# Patient Record
Sex: Female | Born: 1980 | Race: Black or African American | Hispanic: No | Marital: Single | Smoking: Former smoker
Health system: Southern US, Community
[De-identification: ages and names within clinical notes are randomized; demographics above are authoritative.]

## PROBLEM LIST (undated history)

## (undated) ENCOUNTER — Inpatient Hospital Stay (HOSPITAL_COMMUNITY): Payer: Self-pay

## (undated) DIAGNOSIS — D869 Sarcoidosis, unspecified: Secondary | ICD-10-CM

## (undated) HISTORY — PX: OTHER SURGICAL HISTORY: SHX169

---

## 1998-07-04 ENCOUNTER — Inpatient Hospital Stay (HOSPITAL_COMMUNITY): Admission: AD | Admit: 1998-07-04 | Discharge: 1998-07-04 | Payer: Self-pay | Admitting: Obstetrics

## 1998-12-27 ENCOUNTER — Encounter: Payer: Self-pay | Admitting: Emergency Medicine

## 1998-12-27 ENCOUNTER — Emergency Department (HOSPITAL_COMMUNITY): Admission: EM | Admit: 1998-12-27 | Discharge: 1998-12-27 | Payer: Self-pay | Admitting: Emergency Medicine

## 1999-09-04 ENCOUNTER — Emergency Department (HOSPITAL_COMMUNITY): Admission: EM | Admit: 1999-09-04 | Discharge: 1999-09-04 | Payer: Self-pay | Admitting: Emergency Medicine

## 1999-09-15 ENCOUNTER — Inpatient Hospital Stay (HOSPITAL_COMMUNITY): Admission: AD | Admit: 1999-09-15 | Discharge: 1999-09-15 | Payer: Self-pay | Admitting: Obstetrics & Gynecology

## 1999-10-02 ENCOUNTER — Other Ambulatory Visit: Admission: RE | Admit: 1999-10-02 | Discharge: 1999-10-22 | Payer: Self-pay | Admitting: Gynecology

## 1999-10-03 ENCOUNTER — Other Ambulatory Visit: Admission: RE | Admit: 1999-10-03 | Discharge: 1999-10-03 | Payer: Self-pay | Admitting: Obstetrics and Gynecology

## 1999-10-07 ENCOUNTER — Ambulatory Visit (HOSPITAL_COMMUNITY): Admission: RE | Admit: 1999-10-07 | Discharge: 1999-10-07 | Payer: Self-pay | Admitting: Obstetrics and Gynecology

## 1999-10-07 ENCOUNTER — Encounter: Payer: Self-pay | Admitting: Obstetrics and Gynecology

## 1999-11-15 ENCOUNTER — Inpatient Hospital Stay (HOSPITAL_COMMUNITY): Admission: AD | Admit: 1999-11-15 | Discharge: 1999-11-15 | Payer: Self-pay | Admitting: Obstetrics and Gynecology

## 2000-01-24 ENCOUNTER — Ambulatory Visit (HOSPITAL_COMMUNITY): Admission: RE | Admit: 2000-01-24 | Discharge: 2000-01-24 | Payer: Self-pay | Admitting: Obstetrics and Gynecology

## 2000-01-24 ENCOUNTER — Encounter: Payer: Self-pay | Admitting: Obstetrics and Gynecology

## 2000-03-05 ENCOUNTER — Inpatient Hospital Stay (HOSPITAL_COMMUNITY): Admission: AD | Admit: 2000-03-05 | Discharge: 2000-03-07 | Payer: Self-pay | Admitting: Obstetrics and Gynecology

## 2000-03-05 ENCOUNTER — Encounter (INDEPENDENT_AMBULATORY_CARE_PROVIDER_SITE_OTHER): Payer: Self-pay

## 2000-03-08 ENCOUNTER — Encounter: Admission: RE | Admit: 2000-03-08 | Discharge: 2000-05-06 | Payer: Self-pay | Admitting: Obstetrics and Gynecology

## 2000-05-24 ENCOUNTER — Inpatient Hospital Stay (HOSPITAL_COMMUNITY): Admission: AD | Admit: 2000-05-24 | Discharge: 2000-05-24 | Payer: Self-pay | Admitting: Obstetrics and Gynecology

## 2000-06-09 ENCOUNTER — Inpatient Hospital Stay (HOSPITAL_COMMUNITY): Admission: AD | Admit: 2000-06-09 | Discharge: 2000-06-09 | Payer: Self-pay | Admitting: Obstetrics & Gynecology

## 2000-06-09 ENCOUNTER — Encounter: Payer: Self-pay | Admitting: Obstetrics & Gynecology

## 2000-10-18 ENCOUNTER — Inpatient Hospital Stay (HOSPITAL_COMMUNITY): Admission: EM | Admit: 2000-10-18 | Discharge: 2000-10-22 | Payer: Self-pay | Admitting: Emergency Medicine

## 2000-10-19 ENCOUNTER — Encounter: Payer: Self-pay | Admitting: Emergency Medicine

## 2000-10-27 ENCOUNTER — Encounter: Admission: RE | Admit: 2000-10-27 | Discharge: 2000-10-27 | Payer: Self-pay | Admitting: Family Medicine

## 2001-01-29 ENCOUNTER — Emergency Department (HOSPITAL_COMMUNITY): Admission: EM | Admit: 2001-01-29 | Discharge: 2001-01-29 | Payer: Self-pay | Admitting: Emergency Medicine

## 2001-01-29 ENCOUNTER — Encounter: Payer: Self-pay | Admitting: Emergency Medicine

## 2001-11-01 ENCOUNTER — Emergency Department (HOSPITAL_COMMUNITY): Admission: EM | Admit: 2001-11-01 | Discharge: 2001-11-01 | Payer: Self-pay | Admitting: Emergency Medicine

## 2001-11-01 ENCOUNTER — Encounter: Payer: Self-pay | Admitting: Emergency Medicine

## 2002-03-18 ENCOUNTER — Inpatient Hospital Stay (HOSPITAL_COMMUNITY): Admission: AD | Admit: 2002-03-18 | Discharge: 2002-03-18 | Payer: Self-pay | Admitting: Obstetrics & Gynecology

## 2002-03-22 ENCOUNTER — Ambulatory Visit (HOSPITAL_COMMUNITY): Admission: RE | Admit: 2002-03-22 | Discharge: 2002-03-22 | Payer: Self-pay | Admitting: Family Medicine

## 2002-03-22 ENCOUNTER — Encounter: Payer: Self-pay | Admitting: Family Medicine

## 2003-11-20 ENCOUNTER — Inpatient Hospital Stay (HOSPITAL_COMMUNITY): Admission: AD | Admit: 2003-11-20 | Discharge: 2003-11-20 | Payer: Self-pay | Admitting: Obstetrics & Gynecology

## 2005-02-23 ENCOUNTER — Emergency Department (HOSPITAL_COMMUNITY): Admission: EM | Admit: 2005-02-23 | Discharge: 2005-02-24 | Payer: Self-pay | Admitting: Emergency Medicine

## 2005-08-04 ENCOUNTER — Emergency Department (HOSPITAL_COMMUNITY): Admission: EM | Admit: 2005-08-04 | Discharge: 2005-08-04 | Payer: Self-pay | Admitting: Emergency Medicine

## 2005-08-07 ENCOUNTER — Emergency Department (HOSPITAL_COMMUNITY): Admission: EM | Admit: 2005-08-07 | Discharge: 2005-08-07 | Payer: Self-pay | Admitting: Emergency Medicine

## 2005-12-07 ENCOUNTER — Emergency Department (HOSPITAL_COMMUNITY): Admission: EM | Admit: 2005-12-07 | Discharge: 2005-12-07 | Payer: Self-pay | Admitting: Emergency Medicine

## 2006-02-02 ENCOUNTER — Emergency Department (HOSPITAL_COMMUNITY): Admission: EM | Admit: 2006-02-02 | Discharge: 2006-02-02 | Payer: Self-pay | Admitting: Emergency Medicine

## 2006-12-10 ENCOUNTER — Inpatient Hospital Stay (HOSPITAL_COMMUNITY): Admission: AD | Admit: 2006-12-10 | Discharge: 2006-12-10 | Payer: Self-pay | Admitting: Obstetrics and Gynecology

## 2007-02-22 ENCOUNTER — Emergency Department (HOSPITAL_COMMUNITY): Admission: EM | Admit: 2007-02-22 | Discharge: 2007-02-22 | Payer: Self-pay | Admitting: Family Medicine

## 2007-04-01 ENCOUNTER — Emergency Department (HOSPITAL_COMMUNITY): Admission: EM | Admit: 2007-04-01 | Discharge: 2007-04-01 | Payer: Self-pay | Admitting: Emergency Medicine

## 2007-08-10 ENCOUNTER — Emergency Department (HOSPITAL_COMMUNITY): Admission: EM | Admit: 2007-08-10 | Discharge: 2007-08-11 | Payer: Self-pay | Admitting: Pediatrics

## 2007-10-15 ENCOUNTER — Emergency Department (HOSPITAL_COMMUNITY): Admission: EM | Admit: 2007-10-15 | Discharge: 2007-10-15 | Payer: Self-pay | Admitting: Emergency Medicine

## 2008-08-10 ENCOUNTER — Emergency Department (HOSPITAL_COMMUNITY): Admission: EM | Admit: 2008-08-10 | Discharge: 2008-08-10 | Payer: Self-pay | Admitting: Emergency Medicine

## 2009-02-24 ENCOUNTER — Emergency Department (HOSPITAL_COMMUNITY): Admission: EM | Admit: 2009-02-24 | Discharge: 2009-02-24 | Payer: Self-pay | Admitting: Family Medicine

## 2010-03-29 LAB — POCT RAPID STREP A (OFFICE): Streptococcus, Group A Screen (Direct): NEGATIVE

## 2010-04-14 LAB — URINE MICROSCOPIC-ADD ON

## 2010-04-14 LAB — URINALYSIS, ROUTINE W REFLEX MICROSCOPIC
Bilirubin Urine: NEGATIVE
Glucose, UA: NEGATIVE mg/dL
Ketones, ur: 15 mg/dL — AB
Nitrite: POSITIVE — AB
Protein, ur: >300 mg/dL — AB
Specific Gravity, Urine: 1.022 (ref 1.005–1.030)
Urobilinogen, UA: 1 mg/dL (ref 0.0–1.0)
pH: 6 (ref 5.0–8.0)

## 2010-04-14 LAB — POCT PREGNANCY, URINE: Preg Test, Ur: NEGATIVE

## 2010-05-24 NOTE — Discharge Summary (Signed)
[facility]  Patient:    Katelyn Stevens, Katelyn Stevens                          MRN: [id number] Adm. Date:  [id number] Disc. Date: [id number] Attending:  Marcelle Overlie                           Discharge Summary  DISCHARGE DIAGNOSES:          1. Term pregnancy delivered 6 pound 6 ounce female                                  infant with Apgars 9 and 9.                               2. Blood type O+.                               3. Suspected chorioamnionitis.  PROCEDURE:                    1. Induction of labor.                               2. Intrapartum antibiotics.                               3. Normal spontaneous delivery.  SUMMARY:                      This 30 year old gravida 5, para 0 with a due date of February 25 was admitted for induction because of suspected chorioamnionitis.  At the time of admission the patient was found to have a temperature of 101 and a fetal heart in the range of 180.  The patients white count was 24,000.  Her pregnancy was complicated by the anatomic finding of a uterine ______.  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                                Patient was admitted, placed on Unasyn and Pitocin, and had rapid progression with a subsequent normal spontaneous delivery over an intact perineum of a 6 pound 6 ounce female infant with Apgars of 9 and 9.  The patients post delivery course was benign.  Her discharge hemoglobin was 8.5 with a white count of 32,000, platelet count of 235,000. On the morning of March 2 she had been afebrile for approximately 24 hours and was doing well.  She was breast-feeding without difficulty.  She was given all appropriate instructions and discharged to home.  DISCHARGE MEDICATIONS:        1. Vitamin one q.d.                               2. Ferrous sulfate 300 mg q.d.                               3. Tylox one  to two q.4-6h. p.r.n. severe pain.                               4. Motrin 600 mg q.6h. as needed for  less severe                                  pain.  FOLLOW-UP:                    Four weeks time.  Was carefully instructed as to fever, etcetera.  She was given a detailed instruction sheet at the time of discharge.  CONDITION ON DISCHARGE:       Improved. DD:  04/01/00 TD:  04/01/00 Job: [id number]

## 2010-05-24 NOTE — Discharge Summary (Signed)
[place]  Patient:    Katelyn Stevens, Katelyn Stevens Visit Number: [id number] MRN: [id number]          Service Type: MED Location: [id number] Attending Physician:  Tobin Chad Dictated by:   Janalyn Harder, MS-IV Admit Date:  10/18/2000 Discharge Date: 10/22/2000                             Discharge Summary  PRIMARY PHYSICIAN:  Unassigned.  ATTENDING PHYSICIAN:  Engineer, water Service - Wayne A. Sheffield Slider, M.D.  RESIDENT PHYSICIAN:  Pricilla Holm, M.D.  DIAGNOSES: 1. Pyelonephritis. 2. Uterine didelphys.  PRINCIPAL PROCEDURES: 1. CT scan of the abdomen and pelvis. 2. Intravenous antibiotics.  HISTORY OF PRESENT ILLNESS:  Please see admission H&P for full history and physical.  HOSPITAL COURSE:  Katelyn Stevens was admitted on October 18, 2000 complaining of fever, chills and extreme low back pain.  She was found to have a urinary tract infection after urinalysis.  A CT scan was obtained to determine other possible sources of infection other than pyelonephritis secondary to her marked back pain on exam on admission.  Also the patient has a history of uterine didelphys which was concerning for a urinary tract malformation. It was determined that the patient did in fact have pyelonephritis.  No other sources of infection were noted.  She was started on IV Tequin.  Urine culture was obtained.  Culture came back as E. coli which was sensitive to ciprofloxacin so the patient was changed over to IV ciprofloxacin times 24 hours and then changed to p.o. ciprofloxacin.  The patient will be discharged on p.o. Cipro 500 mg one p.o. b.i.d. x7 days.  This patient was discussed with Dr. Annabell Howells of urology secondary to concerns for possible urinary tract malformations with the history of uterine didelphys.  At this time, Dr. Annabell Howells felt that because this was the first episode of pyelonephritis in this patient, that a formal urologic workup was unnecessary;  however, he advised that should the patient experience pyelonephritis again in the future that she will warrant a urologic workup.  The patient has been advised of this.  DISCHARGE MEDICATIONS: Ciprofloxacin 500 mg one p.o. b.i.d. x7 days.  FOLLOW-UP:  The patient will return to see Solon Palm, M.D. of the [facility] on October 29, 2000 at 11:10 a.m. Dictated by:   Janalyn Harder, MS-IV Attending Physician:  Tobin Chad DD:  10/22/00 TD:  10/23/00 Job: [id number]

## 2010-09-30 LAB — POCT URINALYSIS DIP (DEVICE)
Bilirubin Urine: NEGATIVE
Hgb urine dipstick: NEGATIVE
Ketones, ur: NEGATIVE
Protein, ur: NEGATIVE
Specific Gravity, Urine: <=1.005
pH: 6.5

## 2010-09-30 LAB — WET PREP, GENITAL
Clue Cells Wet Prep HPF POC: NONE SEEN
Trich, Wet Prep: NONE SEEN
Yeast Wet Prep HPF POC: NONE SEEN

## 2010-09-30 LAB — POCT PREGNANCY, URINE: Operator id: 235561

## 2010-10-07 ENCOUNTER — Inpatient Hospital Stay (INDEPENDENT_AMBULATORY_CARE_PROVIDER_SITE_OTHER)
Admission: RE | Admit: 2010-10-07 | Discharge: 2010-10-07 | Disposition: A | Discharge status: Home / Self Care | Payer: Self-pay | Source: Ambulatory Visit | Attending: Family Medicine | Admitting: Family Medicine

## 2010-10-07 DIAGNOSIS — N949 Unspecified condition associated with female genital organs and menstrual cycle: Secondary | ICD-10-CM

## 2010-10-07 LAB — POCT PREGNANCY, URINE: Preg Test, Ur: NEGATIVE

## 2011-01-30 ENCOUNTER — Encounter (HOSPITAL_COMMUNITY): Payer: Self-pay | Admitting: *Deleted

## 2011-01-30 ENCOUNTER — Emergency Department (HOSPITAL_COMMUNITY)
Admission: EM | Admit: 2011-01-30 | Discharge: 2011-01-30 | Disposition: A | Discharge status: Home / Self Care | Payer: Self-pay | Attending: Emergency Medicine | Admitting: Emergency Medicine

## 2011-01-30 DIAGNOSIS — H5789 Other specified disorders of eye and adnexa: Secondary | ICD-10-CM | POA: Insufficient documentation

## 2011-01-30 DIAGNOSIS — J3489 Other specified disorders of nose and nasal sinuses: Secondary | ICD-10-CM | POA: Insufficient documentation

## 2011-01-30 DIAGNOSIS — R0981 Nasal congestion: Secondary | ICD-10-CM

## 2011-01-30 DIAGNOSIS — H5710 Ocular pain, unspecified eye: Secondary | ICD-10-CM | POA: Insufficient documentation

## 2011-01-30 DIAGNOSIS — H579 Unspecified disorder of eye and adnexa: Secondary | ICD-10-CM | POA: Insufficient documentation

## 2011-01-30 DIAGNOSIS — H53149 Visual discomfort, unspecified: Secondary | ICD-10-CM | POA: Insufficient documentation

## 2011-01-30 MED ORDER — HYDROXYPROPYL METHYLCELLULOSE 2.5 % OP SOLN: 1.0000 [drp] | Freq: Four times a day (QID) | OPHTHALMIC | Status: DC | PRN

## 2011-01-30 MED ORDER — NAPHAZOLINE-PHENIRAMINE 0.025-0.3 % OP SOLN: 1.0000 [drp] | OPHTHALMIC | Status: AC | PRN

## 2011-01-30 MED ORDER — FLUORESCEIN SODIUM 1 MG OP STRP
1.0000 | ORAL_STRIP | Freq: Once | OPHTHALMIC | Status: AC
  Administered 2011-01-30: 1 via OPHTHALMIC
  Filled 2011-01-30: qty 1

## 2011-01-30 MED ORDER — FLUTICASONE PROPIONATE 50 MCG/ACT NA SUSP: 2.0000 | Freq: Every day | NASAL | Status: DC

## 2011-01-30 MED ORDER — TETRACAINE HCL 0.5 % OP SOLN
1.0000 [drp] | Freq: Once | OPHTHALMIC | Status: AC
  Administered 2011-01-30: 1 [drp] via OPHTHALMIC
  Filled 2011-01-30: qty 2

## 2011-01-30 NOTE — ED Notes (Signed)
Pt reports nasal congestion that is causing her difficulty breathing.  Denies having a cold or feeling like she needs to blow her nose.  Pt also reporting (R) eye discomfort and photophobia.  (R) eye noted to be red.  Denies injury.

## 2011-01-30 NOTE — ED Notes (Addendum)
Patient complaining of right eye pain since last night; rates pain 7/10 on the numerical pain scale.  Describes pain as "burning", "scratching", and "irritating".  Patient reports light sensitivity; states that the light makes her want to keep her right eye closed.  Patient feels that she has a foreign body seen in her eye; none seen upon assessment.  Patient states that she has had pink eye in the past, but that it has never felt like this.  Denies discharge.  Upon assessment, both eyes are red.  Patient alert and oriented x4; PERRL present.  Will continue to monitor.

## 2011-01-30 NOTE — ED Provider Notes (Signed)
History     CSN: [id number]  Arrival date & time 01/30/11  0605   First MD Initiated Contact with Patient 01/30/11 0615      Chief Complaint  Patient presents with  . Eye Problem  . Nasal Congestion    (Consider location/radiation/quality/duration/timing/severity/associated sxs/prior treatment) HPI Comments: Pt presents to the ED w c/o right eye pain, redness, and photophobia. She has had conjunctivitis in past before but states this feels different. She denies change in vision including double vision, blurred vision, pain with EOM or black spots. In addition pt c/o nasal congestion that has been occuring for about a week. She has been using Afrin and saline nasal spray to treat. Symptoms persisted with only mild improvement. Pt denies HA, fever, night sweats and chills. No other complaints  The history is provided by the patient.    History reviewed. No pertinent past medical history.  History reviewed. No pertinent past surgical history.  History reviewed. No pertinent family history.  History  Substance Use Topics  . Smoking status: Not on file  . Smokeless tobacco: Not on file  . Alcohol Use: Not on file    OB History    Grav Para Term Preterm Abortions TAB SAB Ect Mult Living                  Review of Systems  Constitutional: Negative for fever, chills and appetite change.  HENT: Positive for congestion and rhinorrhea. Negative for sore throat.   Eyes: Positive for photophobia, redness and itching. Negative for pain, discharge and visual disturbance.  Respiratory: Negative for shortness of breath.   Cardiovascular: Negative for chest pain and leg swelling.  Gastrointestinal: Negative for abdominal pain.  Genitourinary: Negative for dysuria, urgency and frequency.  Neurological: Negative for dizziness, syncope, weakness, light-headedness, numbness and headaches.  Psychiatric/Behavioral: Negative for confusion.  All other systems reviewed and are  negative.    Allergies  Review of patient's allergies indicates no known allergies.  Home Medications  No current outpatient prescriptions on file.  BP 106/72  Pulse 81  Temp 97.8 F (36.6 C)  Resp 18  SpO2 99%  Physical Exam  Nursing note and vitals reviewed. Constitutional: She is oriented to person, place, and time. She appears well-developed and well-nourished. No distress.  HENT:  Head: Normocephalic and atraumatic.  Nose: Rhinorrhea present.       No maxillary or frontal tenderness to sinuses. No pre or post auricular lymphadenopathy. No presence of rash or herpetic type lesions on face or neck  Eyes: Conjunctivae and EOM are normal.       No increase in IOP bilaterally, no change in visual acuity and equal bilaterally, no fluorescein uptake in affected eye no dendritic lesions. Right eye injected with out drainage. Photophobia in only left eye, NO consensual photophobia present. PERRLA.  Neck: Normal range of motion.  Pulmonary/Chest: Effort normal.  Musculoskeletal: Normal range of motion.  Neurological: She is alert and oriented to person, place, and time.  Skin: Skin is warm and dry. No rash noted. She is not diaphoretic.  Psychiatric: She has a normal mood and affect. Her behavior is normal.    ED Course  Procedures (including critical care time)  Labs Reviewed - No data to display No results found.   No diagnosis found.     MDM  Viral conjunctivitis   Pt PE is non concerning for emergent eye problem. Discussed in depth about need to follow up with opthalmology if there is a  change in vision in any way or symptoms do not improve. She is aware of her diagnosis and that it is highly contagious. Discussed precautions to keep infection from spread. Pt to be dc with artificial tears and a vasoconstrictor/antihistamine for itching. In regards to nasal congestion she has been advised not to use Afrin for its addictive and rebound effects. Pt will be given Flonase.        Jaci Carrel, [place] 01/30/11 4098  Medical screening examination/treatment/procedure(s) were performed by non-physician practitioner and as supervising physician I was immediately available for consultation/collaboration.  Sunnie Nielsen, MD 01/30/11 2252

## 2011-01-30 NOTE — ED Notes (Signed)
Jaci Carrel, [place] at bedside -- notified PA that tetracaine and fluorescein strip is at bedside.

## 2011-02-08 ENCOUNTER — Emergency Department (HOSPITAL_COMMUNITY): Admission: EM | Admit: 2011-02-08 | Discharge: 2011-02-08 | Payer: Self-pay | Source: Home / Self Care

## 2011-04-07 ENCOUNTER — Emergency Department (HOSPITAL_COMMUNITY)
Admission: EM | Admit: 2011-04-07 | Discharge: 2011-04-07 | Disposition: A | Discharge status: Home / Self Care | Payer: Self-pay | Attending: Emergency Medicine | Admitting: Emergency Medicine

## 2011-04-07 ENCOUNTER — Encounter (HOSPITAL_COMMUNITY): Payer: Self-pay | Admitting: Emergency Medicine

## 2011-04-07 DIAGNOSIS — Z87891 Personal history of nicotine dependence: Secondary | ICD-10-CM | POA: Insufficient documentation

## 2011-04-07 DIAGNOSIS — J329 Chronic sinusitis, unspecified: Secondary | ICD-10-CM | POA: Insufficient documentation

## 2011-04-07 MED ORDER — AMOXICILLIN 500 MG PO CAPS: 1000.0000 mg | ORAL_CAPSULE | Freq: Two times a day (BID) | ORAL | Status: AC

## 2011-04-07 MED ORDER — IBUPROFEN 800 MG PO TABS: 800.0000 mg | ORAL_TABLET | Freq: Three times a day (TID) | ORAL | Status: AC

## 2011-04-07 MED ORDER — PREDNISONE (PAK) 10 MG PO TABS: ORAL_TABLET | ORAL | Status: AC

## 2011-04-07 NOTE — ED Notes (Signed)
Pt. Staed, I've been having breathing problems and i think it all stems from a dental problem

## 2011-04-07 NOTE — ED Notes (Signed)
Pt reports not being able to breath through her nose x 2 months.  States that she has been to the ED for eval and was given an OTC nasal spray without relief.  States that her mouth is dry from breathing through her mouth.  Denies cough, chills, stuffy nose or drainage.

## 2011-04-07 NOTE — ED Provider Notes (Signed)
Medical screening examination/treatment/procedure(s) were conducted as a shared visit with non-physician practitioner(s) and myself.  I personally evaluated the patient during the encounter   Gerhard Munch, MD 04/07/11 1527

## 2011-04-07 NOTE — Discharge Instructions (Signed)

## 2011-04-07 NOTE — ED Provider Notes (Signed)
History     CSN: [id number]  Arrival date & time 04/07/11  0757   First MD Initiated Contact with Patient 04/07/11 0815     8:34 AM HPI Patient reports nasal congestion for 2 months. States now she has had increasing difficulty with breathing through her nose. States she was seen for this in January and given a nasal spray. Reports initially was helped but is no longer helping. States when she lays flat her nasal congestion is worse and she begins to feel short of breath. Reports constantly mouth breathing and now has dry lips and nares. Denies chest pain, cough, sneezing, sore throat, headache, fever, neck pain, headache. Reports 3 years ago had a right upper third molar extracted. States within the last month has had significant amount of food but continues to "get stuck in there." Patient states this is nontender and she has had no drainage but she is wondering if this is causing persistent nasal congestion. Patient is a 31 y.o. female presenting with sinusitis. The history is provided by the patient.  Sinusitis  This is a chronic problem. Episode onset: 2 months. The problem has been gradually worsening. There has been no fever. The patient is experiencing no pain. Associated symptoms include congestion, sinus pressure, swollen glands and shortness of breath. Pertinent negatives include no chills, no sweats, no ear pain, no hoarse voice, no sore throat and no cough. Treatments tried: Afrin. The treatment provided mild relief.    History reviewed. No pertinent past medical history.  History reviewed. No pertinent past surgical history.  History reviewed. No pertinent family history.  History  Substance Use Topics  . Smoking status: Former Games developer  . Smokeless tobacco: Not on file  . Alcohol Use: No    OB History    Grav Para Term Preterm Abortions TAB SAB Ect Mult Living                  Review of Systems  Constitutional: Negative for fever and chills.  HENT: Positive for  congestion and sinus pressure. Negative for ear pain, nosebleeds, sore throat, hoarse voice, rhinorrhea, mouth sores, trouble swallowing, neck pain, dental problem, voice change and postnasal drip.   Respiratory: Positive for shortness of breath. Negative for cough.   Cardiovascular: Negative for chest pain.  Skin: Negative for rash.  Neurological: Negative for headaches.  All other systems reviewed and are negative.    Allergies  Review of patient's allergies indicates no known allergies.  Home Medications   Current Outpatient Rx  Name Route Sig Dispense Refill  . OXYMETAZOLINE HCL 0.05 % NA SOLN Nasal Place 2 sprays into the nose 2 (two) times daily as needed. For congestion.    . TYLENOL FLU PO Oral Take 2 capsules by mouth 2 (two) times daily as needed. For cold symptoms      BP 104/69  Pulse 81  Temp(Src) 98.3 F (36.8 C) (Oral)  Resp 18  SpO2 96%  LMP 03/07/2011  Physical Exam  Vitals reviewed. Constitutional: She is oriented to person, place, and time. Vital signs are normal. She appears well-developed and well-nourished.  HENT:  Head: Normocephalic and atraumatic.  Right Ear: Hearing, tympanic membrane, external ear and ear canal normal.  Left Ear: Hearing, tympanic membrane, external ear and ear canal normal.  Nose: No mucosal edema or rhinorrhea. Right sinus exhibits maxillary sinus tenderness. Right sinus exhibits no frontal sinus tenderness. Left sinus exhibits maxillary sinus tenderness. Left sinus exhibits no frontal sinus tenderness.  Mouth/Throat: Uvula  is midline, oropharynx is clear and moist and mucous membranes are normal.    Eyes: Conjunctivae are normal. Pupils are equal, round, and reactive to light.  Neck: Trachea normal and normal range of motion. Neck supple. No tracheal tenderness, no spinous process tenderness and no muscular tenderness present. No tracheal deviation present. No mass and no thyromegaly present.  Cardiovascular: Normal rate, regular  rhythm and normal heart sounds.  Exam reveals no friction rub.   No murmur heard. Pulmonary/Chest: Effort normal and breath sounds normal. No stridor. She has no wheezes. She has no rhonchi. She has no rales. She exhibits no tenderness.  Musculoskeletal: Normal range of motion.  Lymphadenopathy:       Head (right side): Submandibular and tonsillar adenopathy present.       Nontender lymphadenopathy  Neurological: She is alert and oriented to person, place, and time. Coordination normal.  Skin: Skin is warm and dry. No rash noted. No erythema. No pallor.    ED Course  Procedures   MDM   We'll treat patient with amoxicillin and steroid. Will provide patient with ENT referral and advised followup if symptoms continue despite medication. Patient agrees with plan and is ready for discharge.      Thomasene Lot, PA-C 04/07/11 [phone]

## 2011-05-23 ENCOUNTER — Encounter (HOSPITAL_COMMUNITY): Payer: Self-pay

## 2011-05-23 ENCOUNTER — Emergency Department (HOSPITAL_COMMUNITY)
Admission: EM | Admit: 2011-05-23 | Discharge: 2011-05-23 | Disposition: A | Discharge status: Home / Self Care | Payer: Self-pay | Attending: Emergency Medicine | Admitting: Emergency Medicine

## 2011-05-23 DIAGNOSIS — J3489 Other specified disorders of nose and nasal sinuses: Secondary | ICD-10-CM | POA: Insufficient documentation

## 2011-05-23 DIAGNOSIS — R059 Cough, unspecified: Secondary | ICD-10-CM | POA: Insufficient documentation

## 2011-05-23 DIAGNOSIS — Z79899 Other long term (current) drug therapy: Secondary | ICD-10-CM | POA: Insufficient documentation

## 2011-05-23 DIAGNOSIS — R05 Cough: Secondary | ICD-10-CM | POA: Insufficient documentation

## 2011-05-23 DIAGNOSIS — R0602 Shortness of breath: Secondary | ICD-10-CM | POA: Insufficient documentation

## 2011-05-23 DIAGNOSIS — J309 Allergic rhinitis, unspecified: Secondary | ICD-10-CM | POA: Insufficient documentation

## 2011-05-23 MED ORDER — FLUTICASONE PROPIONATE 50 MCG/ACT NA SUSP: 2.0000 | Freq: Every day | NASAL | Status: DC

## 2011-05-23 MED ORDER — ALBUTEROL SULFATE HFA 108 (90 BASE) MCG/ACT IN AERS: 1.0000 | INHALATION_SPRAY | Freq: Four times a day (QID) | RESPIRATORY_TRACT | Status: DC | PRN

## 2011-05-23 MED ORDER — AEROCHAMBER Z-STAT PLUS/MEDIUM MISC: Status: DC

## 2011-05-23 NOTE — ED Notes (Signed)
Pt conversing on cell phone on arrival to triage by RN, pt sts she cant breath now for several months, seen here for same and sts had some improvement but now back to how it was. Mainly nasal congestion.

## 2011-05-23 NOTE — Discharge Instructions (Signed)
You can use a nasal spray like Afrin as needed for congestion while you are on the nasal steroid. Use of the resource guide to find a Dr. to see for ongoing care.  Allergic Rhinitis Allergic rhinitis is when the mucous membranes in the nose respond to allergens. Allergens are particles in the air that cause your body to have an allergic reaction. This causes you to release allergic antibodies. Through a chain of events, these eventually cause you to release histamine into the blood stream (hence the use of antihistamines). Although meant to be protective to the body, it is this release that causes your discomfort, such as frequent sneezing, congestion and an itchy runny nose.  CAUSES  The pollen allergens may come from grasses, trees, and weeds. This is seasonal allergic rhinitis, or "hay fever." Other allergens cause year-round allergic rhinitis (perennial allergic rhinitis) such as house dust mite allergen, pet dander and mold spores.  SYMPTOMS   Nasal stuffiness (congestion).   Runny, itchy nose with sneezing and tearing of the eyes.   There is often an itching of the mouth, eyes and ears.  It cannot be cured, but it can be controlled with medications. DIAGNOSIS  If you are unable to determine the offending allergen, skin or blood testing may find it. TREATMENT   Avoid the allergen.   Medications and allergy shots (immunotherapy) can help.   Hay fever may often be treated with antihistamines in pill or nasal spray forms. Antihistamines block the effects of histamine. There are over-the-counter medicines that may help with nasal congestion and swelling around the eyes. Check with your caregiver before taking or giving this medicine.  If the treatment above does not work, there are many new medications your caregiver can prescribe. Stronger medications may be used if initial measures are ineffective. Desensitizing injections can be used if medications and avoidance fails. Desensitization is  when a patient is given ongoing shots until the body becomes less sensitive to the allergen. Make sure you follow up with your caregiver if problems continue. SEEK MEDICAL CARE IF:   You develop fever (more than 100.5 F (38.1 C).   You develop a cough that does not stop easily (persistent).   You have shortness of breath.   You start wheezing.   Symptoms interfere with normal daily activities.  Document Released: 09/17/2000 Document Revised: 12/12/2010 Document Reviewed: 03/29/2008 [facility] Patient Information 2012 [place], [place].Hay Fever Hay fever is an allergic reaction to particles in the air. It cannot be passed from person to person. It cannot be cured, but it can be controlled. CAUSES  Hay fever is caused by something that triggers an allergic reaction (allergens). The following are examples of allergens:  Ragweed.   Feathers.   Animal dander.   Grass and tree pollens.   Cigarette smoke.   House dust.   Pollution.  SYMPTOMS   Sneezing.   Runny or stuffy nose.   Tearing eyes.   Itchy eyes, nose, mouth, throat, skin, or other area.   Sore throat.   Headache.   Decreased sense of smell or taste.  DIAGNOSIS Your caregiver will perform a physical exam and ask questions about the symptoms you are having.Allergy testing may be done to determine exactly what triggers your hay fever.  TREATMENT   Over-the-counter medicines may help symptoms. These include:   Antihistamines.   Decongestants. These may help with nasal congestion.   Your caregiver may prescribe medicines if over-the-counter medicines do not work.   Some people benefit  from allergy shots when other medicines are not helpful.  HOME CARE INSTRUCTIONS   Avoid the allergen that is causing your symptoms, if possible.   Take all medicine as told by your caregiver.  SEEK MEDICAL CARE IF:   You have severe allergy symptoms and your current medicines are not helping.   Your treatment was  working at one time, but you are now experiencing symptoms.   You have sinus congestion and pressure.   You develop a fever or headache.   You have thick nasal discharge.   You have asthma and have a worsening cough and wheezing.  SEEK IMMEDIATE MEDICAL CARE IF:   You have swelling of your tongue or lips.   You have trouble breathing.   You feel lightheaded or like you are going to faint.   You have cold sweats.   You have a fever.  Document Released: 12/23/2004 Document Revised: 12/12/2010 Document Reviewed: 03/20/2010 [facility] Patient Information 2012 [place], [place].  RESOURCE GUIDE  Dental Problems  Patients with Medicaid: [facility] [phone] [address]                                           [phone] [address] Phone:  [phone]                                                  Phone:  [phone]  If unable to pay or uninsured, contact:  [facility] or [facility]. to become qualified for the adult dental clinic.  Chronic Pain Problems Contact [facility]  [phone] Patients need to be referred by their primary care doctor.  Insufficient Money for Medicine Contact [employer]:  call "211" or [facility] 4107502488.  No Primary Care Doctor Call [facility]  [phone] Other agencies that provide inexpensive medical care    [facility]  [phone]    [facility]  [phone]    [facility]  [phone]    [facility]  5024380015    [facility]  [phone]    [facility]  [phone]  Psychological Services [facility]  [phone] [facility] Services  [phone] [facility]   [phone] (emergency services [phone])  Substance Abuse Resources Alcohol and Drug Services  [phone] [facility] [phone] [facility] [place] [phone] Floydene Flock  [phone] Residential & Outpatient Substance Abuse Program  [phone]  Abuse/Neglect [facility] Child Abuse Hotline [phone] [facility] Child Abuse Hotline 639-202-4184 (After Hours)  Emergency Shelter [facility] [phone]  M[facility] [place] [facility] [place] 8605951148 [facility] [phone]  MRSA Hotline #:   (602)609-6484    [facility] Resources  [facility]     [employer]                          [facility] Dept. [address] [place]  [address]      [address]  Blondell Reveal Phone:  [phone]                                   Phone:  [phone]                 Phone:  [phone]  [facility] Mental Health Phone:  [phone]  [facility] Child Abuse Hotline [phone] (After Hours)

## 2011-05-23 NOTE — ED Notes (Signed)
Pt has ongoing SOB that she has been previously seen for. Pt states that she has not been diagnosed with respiratory illness and does not take any meds. Pt states that SOB is worse at night, she is afraid to go to sleep.

## 2011-05-23 NOTE — ED Provider Notes (Signed)
History  This chart was scribed for Flint Melter, MD by Bennett Scrape. This patient was seen in room STRE1/STRE1 and the patient's care was started at 12:45PM.  CSN: [id number]  Arrival date & time 05/23/11  1141   First MD Initiated Contact with Patient 05/23/11 1245      Chief Complaint  Patient presents with  . Shortness of Breath     The history is provided by the patient. No language interpreter was used.    Katelyn Stevens is a 31 y.o. female who presents to the Emergency Department complaining of several months of gradual onset, non-changing, constant SOB described as not being able to take a deep breath with associated nasal congestion and cough. She states that she was seen here last month for the same symptoms and was given prednisone, ibuprofen and amoxicillin. She states that the prescriptions did improve her symptoms temporarily. Pt states that she has also changed the filters in her house and has stopped doing her normal work out routine without improvement. She denies fever, nausea, emesis, chest pain and sore throat associated symptoms. She has no h/o chronic medical problems. She is a former smoker but denies alcohol use.   History reviewed. No pertinent past medical history.  History reviewed. No pertinent past surgical history.  History reviewed. No pertinent family history.  History  Substance Use Topics  . Smoking status: Former Games developer  . Smokeless tobacco: Not on file  . Alcohol Use: No     Review of Systems  A complete 10 system review of systems was obtained and all systems are negative except as noted in the HPI and PMH.   Allergies  Review of patient's allergies indicates no known allergies.  Home Medications   Current Outpatient Rx  Name Route Sig Dispense Refill  . TYLENOL FLU PO Oral Take 2 capsules by mouth 2 (two) times daily as needed. For cold symptoms    . ALBUTEROL SULFATE HFA 108 (90 BASE) MCG/ACT IN AERS Inhalation Inhale 1-2  puffs into the lungs every 6 (six) hours as needed for wheezing. 1 Inhaler 0  . FLUTICASONE PROPIONATE 50 MCG/ACT NA SUSP Nasal Place 2 sprays into the nose daily. 16 g 0  . AEROCHAMBER Z-STAT PLUS/MEDIUM MISC  Use as instructed 1 each 2    With inhaler    Triage Vitals: BP 90/59  Pulse 75  Temp(Src) 98.2 F (36.8 C) (Oral)  Resp 18  SpO2 95%  LMP 05/07/2011  Physical Exam  Nursing note and vitals reviewed. Constitutional: She is oriented to person, place, and time. She appears well-developed and well-nourished.  HENT:  Head: Normocephalic and atraumatic.  Eyes: Conjunctivae and EOM are normal. Pupils are equal, round, and reactive to light.  Neck: Normal range of motion and phonation normal. Neck supple.  Cardiovascular: Normal rate, regular rhythm and intact distal pulses.   Pulmonary/Chest: Effort normal and breath sounds normal. She has no wheezes. She exhibits no tenderness.       Decreased air movement bilaterally  Abdominal: Soft. She exhibits no distension. There is no tenderness. There is no guarding.  Musculoskeletal: Normal range of motion.  Neurological: She is alert and oriented to person, place, and time. She has normal strength. She exhibits normal muscle tone.  Skin: Skin is warm and dry.  Psychiatric: She has a normal mood and affect. Her behavior is normal. Judgment and thought content normal.    ED Course  Procedures (including critical care time)  DIAGNOSTIC STUDIES: Oxygen  Saturation is 95% on room air, adequate by my interpretation.    COORDINATION OF CARE: 1:37PM-Discussed nasal spray, Afrin as needed and inhaler as needed and pt agreed. Advised to follow up with a PCP.    Labs Reviewed - No data to display No results found.   1. Allergic rhinitis       MDM  Allergic rhinitis; doubt sinusitis, serious bacterial infection or metabolic instability. She likely has secondary bronchospasm. She is stable for discharge.      I personally  performed the services described in this documentation, which was scribed in my presence. The recorded information has been reviewed and considered.     Plan: Home Medications- Flonase and Albuterol inhalers; Home Treatments- Rest, Fluids; Recommended follow up- PCP prn   Flint Melter, MD 05/23/11 2035

## 2011-06-15 ENCOUNTER — Emergency Department (HOSPITAL_COMMUNITY): Payer: Self-pay

## 2011-06-15 ENCOUNTER — Encounter (HOSPITAL_COMMUNITY): Payer: Self-pay | Admitting: *Deleted

## 2011-06-15 ENCOUNTER — Emergency Department (HOSPITAL_COMMUNITY)
Admission: EM | Admit: 2011-06-15 | Discharge: 2011-06-15 | Disposition: A | Discharge status: Home / Self Care | Payer: Self-pay | Attending: Emergency Medicine | Admitting: Emergency Medicine

## 2011-06-15 DIAGNOSIS — R059 Cough, unspecified: Secondary | ICD-10-CM | POA: Insufficient documentation

## 2011-06-15 DIAGNOSIS — R05 Cough: Secondary | ICD-10-CM | POA: Insufficient documentation

## 2011-06-15 DIAGNOSIS — IMO0002 Reserved for concepts with insufficient information to code with codable children: Secondary | ICD-10-CM | POA: Insufficient documentation

## 2011-06-15 DIAGNOSIS — R0602 Shortness of breath: Secondary | ICD-10-CM | POA: Insufficient documentation

## 2011-06-15 DIAGNOSIS — R599 Enlarged lymph nodes, unspecified: Secondary | ICD-10-CM | POA: Insufficient documentation

## 2011-06-15 DIAGNOSIS — J45909 Unspecified asthma, uncomplicated: Secondary | ICD-10-CM | POA: Insufficient documentation

## 2011-06-15 DIAGNOSIS — Z79899 Other long term (current) drug therapy: Secondary | ICD-10-CM | POA: Insufficient documentation

## 2011-06-15 DIAGNOSIS — R591 Generalized enlarged lymph nodes: Secondary | ICD-10-CM

## 2011-06-15 LAB — COMPREHENSIVE METABOLIC PANEL
ALT: 20 U/L (ref 0–35)
AST: 44 U/L — ABNORMAL HIGH (ref 0–37)
Albumin: 3.3 g/dL — ABNORMAL LOW (ref 3.5–5.2)
CO2: 23 mEq/L (ref 19–32)
Calcium: 9.4 mg/dL (ref 8.4–10.5)
Chloride: 101 mEq/L (ref 96–112)
Creatinine, Ser: 0.82 mg/dL (ref 0.50–1.10)
GFR calc non Af Amer: >90 mL/min (ref >90)
Sodium: 135 mEq/L (ref 135–145)

## 2011-06-15 LAB — SEDIMENTATION RATE: Sed Rate: 9 mm/hr (ref 0–22)

## 2011-06-15 LAB — DIFFERENTIAL
Basophils Absolute: 0 10*3/uL (ref 0.0–0.1)
Basophils Relative: 0 % (ref 0–1)
Lymphocytes Relative: 25 % (ref 12–46)
Monocytes Absolute: 0.8 10*3/uL (ref 0.1–1.0)
Neutro Abs: 2.5 10*3/uL (ref 1.7–7.7)

## 2011-06-15 LAB — CBC
MCHC: 35.6 g/dL (ref 30.0–36.0)
Platelets: 231 10*3/uL (ref 150–400)
RDW: 14.8 % (ref 11.5–15.5)
WBC: 4.5 10*3/uL (ref 4.0–10.5)

## 2011-06-15 LAB — URINALYSIS, ROUTINE W REFLEX MICROSCOPIC
Bilirubin Urine: NEGATIVE
Glucose, UA: NEGATIVE mg/dL
Hgb urine dipstick: NEGATIVE
Specific Gravity, Urine: 1.017 (ref 1.005–1.030)

## 2011-06-15 LAB — POCT PREGNANCY, URINE: Preg Test, Ur: NEGATIVE

## 2011-06-15 MED ORDER — IOHEXOL 300 MG/ML  SOLN
80.0000 mL | Freq: Once | INTRAMUSCULAR | Status: AC | PRN
  Administered 2011-06-15: 80 mL via INTRAVENOUS

## 2011-06-15 MED ORDER — IPRATROPIUM BROMIDE 0.02 % IN SOLN
0.5000 mg | Freq: Once | RESPIRATORY_TRACT | Status: AC
Start: 2011-06-15 — End: 2011-06-15
  Administered 2011-06-15: 0.5 mg via RESPIRATORY_TRACT
  Filled 2011-06-15: qty 2.5

## 2011-06-15 MED ORDER — ALBUTEROL SULFATE (5 MG/ML) 0.5% IN NEBU
5.0000 mg | INHALATION_SOLUTION | Freq: Once | RESPIRATORY_TRACT | Status: AC
  Administered 2011-06-15: 5 mg via RESPIRATORY_TRACT
  Filled 2011-06-15: qty 1

## 2011-06-15 MED ORDER — PREDNISONE 20 MG PO TABS: 40.0000 mg | ORAL_TABLET | Freq: Every day | ORAL | Status: DC

## 2011-06-15 MED ORDER — PREDNISONE 20 MG PO TABS
40.0000 mg | ORAL_TABLET | Freq: Once | ORAL | Status: AC
  Administered 2011-06-15: 40 mg via ORAL
  Filled 2011-06-15: qty 2

## 2011-06-15 NOTE — Discharge Instructions (Signed)
Lymphadenopathy Lymphadenopathy means "disease of the lymph glands." But the term is usually used to describe swollen or enlarged lymph glands, also called lymph nodes. These are the bean-shaped organs found in many locations including the neck, underarm, and groin. Lymph glands are part of the immune system, which fights infections in your body. Lymphadenopathy can occur in just one area of the body, such as the neck, or it can be generalized, with lymph node enlargement in several areas. The nodes found in the neck are the most common sites of lymphadenopathy. CAUSES  When your immune system responds to germs (such as viruses or bacteria ), infection-fighting cells and fluid build up. This causes the glands to grow in size. This is usually not something to worry about. Sometimes, the glands themselves can become infected and inflamed. This is called lymphadenitis. Enlarged lymph nodes can be caused by many diseases:  Bacterial disease, such as strep throat or a skin infection.   Viral disease, such as a common cold.   Other germs, such as lyme disease, tuberculosis, or sexually transmitted diseases.   Cancers, such as lymphoma (cancer of the lymphatic system) or leukemia (cancer of the white blood cells).   Inflammatory diseases such as lupus or rheumatoid arthritis.   Reactions to medications.  Many of the diseases above are rare, but important. This is why you should see your caregiver if you have lymphadenopathy. SYMPTOMS   Swollen, enlarged lumps in the neck, back of the head or other locations.   Tenderness.   Warmth or redness of the skin over the lymph nodes.   Fever.  DIAGNOSIS  Enlarged lymph nodes are often near the source of infection. They can help healthcare providers diagnose your illness. For instance:   Swollen lymph nodes around the jaw might be caused by an infection in the mouth.   Enlarged glands in the neck often signal a throat infection.   Lymph nodes that  are swollen in more than one area often indicate an illness caused by a virus.  Your caregiver most likely will know what is causing your lymphadenopathy after listening to your history and examining you. Blood tests, x-rays or other tests may be needed. If the cause of the enlarged lymph node cannot be found, and it does not go away by itself, then a biopsy may be needed. Your caregiver will discuss this with you. TREATMENT  Treatment for your enlarged lymph nodes will depend on the cause. Many times the nodes will shrink to normal size by themselves, with no treatment. Antibiotics or other medicines may be needed for infection. Only take over-the-counter or prescription medicines for pain, discomfort or fever as directed by your caregiver. HOME CARE INSTRUCTIONS  Swollen lymph glands usually return to normal when the underlying medical condition goes away. If they persist, contact your health-care provider. He/she might prescribe antibiotics or other treatments, depending on the diagnosis. Take any medications exactly as prescribed. Keep any follow-up appointments made to check on the condition of your enlarged nodes.  SEEK MEDICAL CARE IF:   Swelling lasts for more than two weeks.   You have symptoms such as weight loss, night sweats, fatigue or fever that does not go away.   The lymph nodes are hard, seem fixed to the skin or are growing rapidly.   Skin over the lymph nodes is red and inflamed. This could mean there is an infection.  SEEK IMMEDIATE MEDICAL CARE IF:   Fluid starts leaking from the area of the   enlarged lymph node.   You develop a fever of 102 F (38.9 C) or greater.   Severe pain develops (not necessarily at the site of a large lymph node).   You develop chest pain or shortness of breath.   You develop worsening abdominal pain.  MAKE SURE YOU:   Understand these instructions.   Will watch your condition.   Will get help right away if you are not doing well or get  worse.  Document Released: 10/02/2007 Document Revised: 12/12/2010 Document Reviewed: 10/02/2007 [facility] Patient Information 2012 [place], [place].  Sarcoidosis, Schaumann's Disease, Sarcoid of [place] Sarcoidosis appears briefly and heals naturally in 60 to 70 percent of cases, often without the patient knowing or doing anything about it. 20 to 30 percent of patients with sarcoidosis are left with some permanent lung damage. In 10 to 15 percent of the patients, sarcoidosis can become chronic (long lasting). When either the granulomas or fibrosis seriously affect the function of a vital organ (lungs, heart, nervous system, liver, or kidneys), sarcoidosis can be fatal. This occurs 5 to 10 percent of the time. No one can predict how sarcoidosis will progress in an individual patient. The symptoms the patient experiences, the caregiver's findings, and the patient's race can give some clues. Sarcoidosis was once considered a rare disease. We now know that it is a common chronic illness that appears all over the world. It is the most common of the fibrotic (scarring) lung disorders. Anyone can get sarcoidosis. It occurs in all races and in both sexes. The risk is greater if you are a young black adult, especially a black woman, or are of [place], [place], [place], or [place] origin. In sarcoidosis, small lumps (also called nodules or granulomas) develop in multiple organs of the body. These granulomas are small collections of inflamed cells. They commonly appear in the lungs. This is the most common organ affected. They also occur in the lymph nodes (your glands), skin, liver, and eyes. The granulomas vary in the amount of disease they produce from very little with no problems (symptoms) to causing severe illness. The cause of sarcoidosis is not known. It may be due to an abnormal immune reaction in the body. Most people will recover. A few people will develop long lasting conditions that may get worse. Women  are affected more often than men. The majority of those affected are under forty years of age. Because we do not know the cause, we do not have ways to prevent it. SYMPTOMS   Fever.   Loss of appetite.   Night sweats.   Joint pain.   Aching muscles  Symptoms vary because the disease affects different parts of the body in different people. Most people who see their caregiver with sarcoidosis have lung problems. The first signs are usually a dry cough and shortness of breath. There may also be wheezing, chest pain, or a cough that brings up bloody mucus. In severe cases, lung function may become so poor that the person cannot perform even the simple routine tasks of daily life. Other symptoms of sarcoidosis are less common than lung symptoms. They can include:  Skin symptoms. Sarcoidosis can appear as a collection of tender, red bumps called erythema nodosum. These bumps usually occur on the face, shins, and arms. They can also occur as a scaly, purplish discoloration on the nose, cheeks, and ears. This is called lupus pernio. Less often, sarcoidosis causes cysts, pimples, or disfiguring over growths of skin. In many cases, the  disfiguring over growths develop in areas of scars or tattoos.   Eye symptoms. These include redness, eye pain, and sensitivity to light.   Heart symptoms. These include irregular heartbeat and heart failure.   Other symptoms. A person may have paralyzed facial muscles, seizures, psychiatric symptoms, swollen salivary glands, or bone pain.  DIAGNOSIS  Even when there are no symptoms, your caregiver can sometimes pick up signs of sarcoidosis during a routine examination, usually through a chest x-ray or when checking other complaints. The patient's age and race or ethnic group can raise an additional red flag that a sign or symptom could be related to sarcoidosis.   Enlargement of the salivary or tear glands and cysts in bone tissue may also be caused by sarcoidosis.    You may have had a biopsy done that shows signs of sarcoidosis. A biopsy is a small tissue sample that is removed for laboratory testing. This tissue sample can be taken from your lung, skin, lip, or another inflamed or abnormal area of the body.   You may have had an abnormal chest X-ray. Although you appear healthy, a chest X-ray ordered for other reasons may turn up abnormalities that suggest sarcoidosis.   Other tests may be needed. These tests may be done to rule out other illnesses or to determine the amount of organ damage caused by sarcoidosis. Some of the most common tests are:   Blood levels of calcium or angiotensin-converting enzyme may be high in people with sarcoidosis.   Blood tests to evaluate how well your liver is functioning.   Lung function tests to measure how well you are breathing.   A complete eye examination.  TREATMENT  If sarcoidosis does not cause any problems, treatment may not be necessary. Your caregiver may decide to simply monitor your condition. As part of this monitoring process, you may have frequent office visits, follow-up chest X-rays, and tests of your lung function.If you have signs of moderate or severe lung disease, your doctor may recommend:  A corticosteroid drug, such as prednisone (sold under several brand names).   Corticosteroids also are used to treat sarcoidosis of the eyes, joints, skin, nerves, or heart.   Corticosteroid eye drops may be used for the eyes.   Over-the-counter medications like nonsteroidal anti-inflammatory drugs (NSAID) often are used to treat joint pain first before corticosteroids, which tend to have more side effects.   If corticosteroids are not effective or cause serious side effects, other drugs that alter or suppress the immune system may be used.   In rare cases, when sarcoidosis causes life-threatening lung disease, a lung transplant may be necessary. However, there is some risk that the new lungs also will be  attacked by sarcoidosis.  SEEK IMMEDIATE MEDICAL CARE IF:   You suffer from shortness of breath or a lingering cough.   You develop new problems that may be related to the disease. Remember this disease can affect almost all organs of the body and cause many different problems.  Document Released: 10/24/2003 Document Revised: 12/12/2010 Document Reviewed: 04/02/2005 [facility] Patient Information 2012 [place], [place].

## 2011-06-15 NOTE — ED Provider Notes (Signed)
Medical screening examination/treatment/procedure(s) were performed by non-physician practitioner and as supervising physician I was immediately available for consultation/collaboration.   Dione Booze, MD 06/15/11 1520

## 2011-06-15 NOTE — ED Provider Notes (Signed)
History     CSN: [id number]  Arrival date & time 06/15/11  0542   First MD Initiated Contact with Patient 06/15/11 [phone]      Chief Complaint  Patient presents with  . Sore Throat  . Cough    (Consider location/radiation/quality/duration/timing/severity/associated sxs/prior treatment) HPI  Pt presents to the ED complaining of shortness of breath, cough, throat pain for two weeks. She has been using Fluticasone nasal spray and albuterol inhaler at home but informs me she is not getting better. She denies having a history of asthma and informs me that she is not getting better. She also has been having a sore throat. She informs me that her lymph nodes are swollen. She thinks she needs antibiotics this time. She denies having nausea, vomiting, diarrhea, chills, weakness, or abdominal pains. Pt in no acute distress. VWNL  History reviewed. No pertinent past medical history.  Past Surgical History  Procedure Date  . Uterine didelphis     No family history on file.  History  Substance Use Topics  . Smoking status: Former Games developer  . Smokeless tobacco: Not on file  . Alcohol Use: No    OB History    Grav Para Term Preterm Abortions TAB SAB Ect Mult Living                  Review of Systems   HEENT: denies blurry vision or change in hearing PULMONARY: Denies wheezing CARDIAC: denies chest pain or heart palpitations MUSCULOSKELETAL:  denies being unable to ambulate ABDOMEN AL: denies abdominal pain GU: denies loss of bowel or urinary control NEURO: denies numbness and tingling in extremities SKIN: no new rashes PSYCH: patient behavior is normal NECK: Not complaining of neck pain   Allergies  Review of patient's allergies indicates no known allergies.  Home Medications   Current Outpatient Rx  Name Route Sig Dispense Refill  . ALBUTEROL SULFATE HFA 108 (90 BASE) MCG/ACT IN AERS Inhalation Inhale 1-2 puffs into the lungs every 6 (six) hours as needed for wheezing. 1  Inhaler 0  . FLUTICASONE PROPIONATE 50 MCG/ACT NA SUSP Nasal Place 2 sprays into the nose daily. 16 g 0  . PREDNISONE 20 MG PO TABS Oral Take 2 tablets (40 mg total) by mouth daily. 28 tablet 0    BP 101/76  Pulse 82  Temp(Src) 99.3 F (37.4 C) (Oral)  Resp 20  SpO2 98%  LMP 05/10/2011  Physical Exam  Nursing note and vitals reviewed. Constitutional: She appears well-developed and well-nourished. No distress.  HENT:  Head: Normocephalic and atraumatic.  Right Ear: External ear normal.  Left Ear: External ear normal.  Eyes: Pupils are equal, round, and reactive to light.  Neck: Normal range of motion. Neck supple.  Cardiovascular: Normal rate and regular rhythm.   Pulmonary/Chest: Effort normal. No respiratory distress. She has no wheezes.  Abdominal: Soft.  Lymphadenopathy:       Head (right side): Tonsillar adenopathy present.       Head (left side): Tonsillar adenopathy present.  Neurological: She is alert.  Skin: Skin is warm and dry.      ED Course  Procedures (including critical care time)  Labs Reviewed  CBC - Abnormal; Notable for the following:    HCT 35.1 (*)    MCV 73.1 (*)    All other components within normal limits  DIFFERENTIAL - Abnormal; Notable for the following:    Monocytes Relative 17 (*)    All other components within normal limits  COMPREHENSIVE METABOLIC PANEL - Abnormal; Notable for the following:    Glucose, Bld 108 (*)    Albumin 3.3 (*)    AST 44 (*)    All other components within normal limits  URINALYSIS, ROUTINE W REFLEX MICROSCOPIC  SEDIMENTATION RATE  POCT PREGNANCY, URINE   Dg Chest 2 View  06/15/2011  *RADIOLOGY REPORT*  Clinical Data: Difficulty breathing and wheezing.  CHEST - 2 VIEW  Comparison: None.  Findings: Two views of the chest demonstrates soft tissue fullness in the right paratracheal region.  There is also enlargement of the hilar tissue, left side greater than right.  Findings raise concern for mediastinal and hilar  lymphadenopathy. There are slightly prominent interstitial markings without focal airspace disease. Heart size is normal.  No evidence for pleural effusions.  Fluid or thickening involving the fissures on the lateral view.  IMPRESSION: Abnormal mediastinal and hilar contours.  Findings are concerning for lymphadenopathy.  Recommend further evaluation with a chest CT with IV contrast.  These results were called by telephone on 06/15/2011  at  07:31 a.m. to  Dr. Preston Fleeting, who verbally acknowledged these results.  Original Report Authenticated By: Richarda Overlie, M.D.   Ct Chest W Contrast  06/15/2011  *RADIOLOGY REPORT*  Clinical Data:  Congestion, cough, abnormal chest x-ray  CT CHEST WITH CONTRAST  Technique:  Multidetector CT imaging of the chest was performed following the standard protocol during bolus administration of intravenous contrast.  Contrast: 80mL OMNIPAQUE IOHEXOL 300 MG/ML  SOLN  Comparison: Chest x-ray same day  Findings: Sagittal images of the spine are unremarkable.  Central airways are patent.  Left axillary lymph node measures 1.1 x 1.2 cm.  Right axillary lymph node measures 1.1 x 1 cm.  Right upper paratracheal enlarged lymph node measures 1.8 x 1.5 cm. Pretracheal lymph node axial image measures 2.3 x 2 cm.  Subcarinal lymph node measures 2.4 by.  There is bilateral hilar adenopathy. Right hilar lymph node measures 1.8 x 1.6 cm.  Left hilar lymph node measures 1.5 x 2 cm.  Left mediastinal lymph node adjacent to pulmonary artery measures 1.6 x 1.2 cm.  A right retrocrural lymph node axial image 47 measures 1.7 x 1.6 cm.  There is a confluent adenopathy in the retroperitoneum and upper abdomen.  The largest lymph node portocaval region measures 2.1 x 2.2 cm.  The largest aortocaval lymph node measures 1.8 x 1.8 cm. Left paraaortic retroperitoneal lymph node measures 1.6 x 1.8 cm. Mild splenomegaly noted. Spleen measures 10.6 x 0.8 cm.  Images of the lung parenchyma shows symmetrical mild interstitial  prominence bilaterally.  There is symmetrical micronodular reticular interstitial prominence bilaterally.  Findings may be due to sarcoid, lymphoproliferative disease or lymphoma.  Clinical correlation and further evaluation is recommended. No segmental infiltrate or pulmonary edema.  IMPRESSION:  1.  There is mediastinal and hilar adenopathy.  Adenopathy is noted in the upper abdomen, portocaval and retroperitoneum. Bilateral lung parenchyma there is interstitial prominence with reticular micronodular prominence.  Findings may be due to sarcoid, lymphoproliferative disease or lymphoma.  Further evaluation is recommended. 2.  No segmental infiltrate or convincing pulmonary edema. 3.  No destructive bony lesions are noted.  I discussed with Dr. Preston Fleeting.  Original Report Authenticated By: Natasha Mead, M.D.     1. Lymphadenopathy       MDM  Patient given albuterol and atrovent treatment in ED. Also Chest xray ordered.   Chest x ray showed abnormal mediastinal and hilar contours concerning for adenopathy.  Therefore, CT angio of the chest ordered. Shows mediastinal and hilar adenopathy, also noted in the upper abdomen, and other locations. Differential Sarcoid or lymphoma. Pt needs bio spy to differentiate.  Patient has been discussed with Dr. Preston Fleeting. Will start on steroids and refer to [facility] for biopsy.   Patient has been made aware of all findings, differential and plan for further work-up. I have answered her questions and advised her of who to follow-up with.    Dorthula Matas, PA 06/15/11 1036

## 2011-06-15 NOTE — ED Notes (Signed)
Pt was given an inhaler and fluticasone nasal spray 2 weeks prior for nasal congestion and difficulty breathing.  Denies hx of asthma or smoking.  States s/s have not improved.  In fact, she continues to feel sob and 2 days prior she awoke with a sore throat.  Pt swollen lymph nodes.  VS WNL.  Low grade fever.

## 2011-07-16 ENCOUNTER — Emergency Department (HOSPITAL_COMMUNITY): Admission: RE | Admit: 2011-07-16 | Payer: Self-pay | Source: Ambulatory Visit

## 2011-07-16 ENCOUNTER — Emergency Department (HOSPITAL_COMMUNITY)
Admission: EM | Admit: 2011-07-16 | Discharge: 2011-07-16 | Disposition: A | Discharge status: Home / Self Care | Payer: Self-pay | Attending: Emergency Medicine | Admitting: Emergency Medicine

## 2011-07-16 ENCOUNTER — Encounter (HOSPITAL_COMMUNITY): Payer: Self-pay

## 2011-07-16 DIAGNOSIS — J329 Chronic sinusitis, unspecified: Secondary | ICD-10-CM | POA: Insufficient documentation

## 2011-07-16 DIAGNOSIS — R599 Enlarged lymph nodes, unspecified: Secondary | ICD-10-CM | POA: Insufficient documentation

## 2011-07-16 DIAGNOSIS — R0989 Other specified symptoms and signs involving the circulatory and respiratory systems: Secondary | ICD-10-CM | POA: Insufficient documentation

## 2011-07-16 DIAGNOSIS — R0609 Other forms of dyspnea: Secondary | ICD-10-CM | POA: Insufficient documentation

## 2011-07-16 DIAGNOSIS — J3489 Other specified disorders of nose and nasal sinuses: Secondary | ICD-10-CM | POA: Insufficient documentation

## 2011-07-16 LAB — CBC
HCT: 36.6 % (ref 36.0–46.0)
MCHC: 35.2 g/dL (ref 30.0–36.0)
MCV: 74.1 fL — ABNORMAL LOW (ref 78.0–100.0)
Platelets: 268 10*3/uL (ref 150–400)
RDW: 15.2 % (ref 11.5–15.5)

## 2011-07-16 LAB — BASIC METABOLIC PANEL
BUN: 12 mg/dL (ref 6–23)
Calcium: 9.6 mg/dL (ref 8.4–10.5)
Creatinine, Ser: 0.84 mg/dL (ref 0.50–1.10)
GFR calc Af Amer: >90 mL/min (ref >90)
GFR calc non Af Amer: >90 mL/min (ref >90)

## 2011-07-16 MED ORDER — AMOXICILLIN 500 MG PO CAPS: 500.0000 mg | ORAL_CAPSULE | Freq: Three times a day (TID) | ORAL | Status: AC

## 2011-07-16 MED ORDER — GUAIFENESIN ER 600 MG PO TB12: 1200.0000 mg | ORAL_TABLET | Freq: Two times a day (BID) | ORAL | Status: DC

## 2011-07-16 MED ORDER — CETIRIZINE-PSEUDOEPHEDRINE ER 5-120 MG PO TB12: 1.0000 | ORAL_TABLET | Freq: Every day | ORAL | Status: DC

## 2011-07-16 NOTE — ED Notes (Signed)
Pt sts that she cant breath well for months now, swollen lymph nodes, she has to breath through her mouth so her lips are dark and she has thick yellow mucous

## 2011-07-16 NOTE — ED Notes (Signed)
Pt states that she has nasal congestion and has been taking afrin with no relief. Pt states that she will blow her nose and in 30 minutes she can't breath through it again. Pt states she isn't getting enough oxygen and it's making her lips dark. No respiratory distress noted to pt. Pt resting comfortably and was able to ambulate to stretcher in NAD.

## 2011-07-16 NOTE — ED Notes (Signed)
Pt d/c home in NAD. Pt voiced understanding of d/c instructions and follow up care. Pt instructed to finish all prescriptions. Pt ambulated with quick steady gait, ABCs intact

## 2011-07-16 NOTE — ED Provider Notes (Signed)
History     CSN: [id number]  Arrival date & time 07/16/11  1805   First MD Initiated Contact with Patient 07/16/11 2008      Chief Complaint  Patient presents with  . Respiratory Distress   HPI  History provided by the patient. Patient is a 31 year old female with no significant past medical history presents with complaints of breathing difficulties. Patient states that she has nasal congestion and sinus pressure for the past month or longer has been unable to breathe out of her nose. She states this is is making breathing very difficult. She denies feeling short of breath or having chest discomfort. She denies any heart palpitations. Patient also reports having some associated swelling of the lymph nodes are neck. She denies having any fever, chills or sweats. She denies having any sore throat or throat swelling. Patient states she was seen for these complaints and started on prednisone without any improvements 3 weeks ago. Patient has not taken any other medications.    History reviewed. No pertinent past medical history.  Past Surgical History  Procedure Date  . Uterine didelphis     History reviewed. No pertinent family history.  History  Substance Use Topics  . Smoking status: Former Games developer  . Smokeless tobacco: Not on file  . Alcohol Use: No    OB History    Grav Para Term Preterm Abortions TAB SAB Ect Mult Living                  Review of Systems  Constitutional: Negative for fever and chills.  HENT: Positive for congestion and sinus pressure. Negative for sore throat and trouble swallowing.   Respiratory: Negative for cough and wheezing.   Cardiovascular: Negative for chest pain.  Gastrointestinal: Negative for nausea and vomiting.  Skin: Negative for rash.    Allergies  Review of patient's allergies indicates no known allergies.  Home Medications  No current outpatient prescriptions on file.  BP 112/81  Pulse 92  Temp 99.2 F (37.3 C) (Oral)  Resp  18  SpO2 98%  Physical Exam  Nursing note and vitals reviewed. Constitutional: She is oriented to person, place, and time. She appears well-developed and well-nourished. No distress.  HENT:  Head: Normocephalic.  Mouth/Throat: Oropharynx is clear and moist.       Poor air movement through both nostrils. Edema of nasal mucosa. No drainage. Mild tenderness over maxillary sinuses.  Normal appearing tonsils or oropharynx. No erythema or exudate.  Poor dentition with multiple caries  Eyes: Conjunctivae are normal.  Neck: Normal range of motion. Neck supple.       Right anterior lymphadenopathy.  Cardiovascular: Normal rate and regular rhythm.   Pulmonary/Chest: Effort normal and breath sounds normal. No respiratory distress. She has no wheezes. She has no rales.  Abdominal: Soft.  Lymphadenopathy:    She has cervical adenopathy.  Neurological: She is alert and oriented to person, place, and time.  Skin: Skin is warm and dry. No rash noted.  Psychiatric: She has a normal mood and affect. Her behavior is normal.    ED Course  Procedures  Results for orders placed during the hospital encounter of 07/16/11  CBC      Component Value Range   WBC 5.1  4.0 - 10.5 K/uL   RBC 4.94  3.87 - 5.11 MIL/uL   Hemoglobin 12.9  12.0 - 15.0 g/dL   HCT 56.2  13.0 - 86.5 %   MCV 74.1 (*) 78.0 - 100.0 fL  MCH 26.1  26.0 - 34.0 pg   MCHC 35.2  30.0 - 36.0 g/dL   RDW 16.1  09.6 - 04.5 %   Platelets 268  150 - 400 K/uL  BASIC METABOLIC PANEL      Component Value Range   Sodium 134 (*) 135 - 145 mEq/L   Potassium 4.0  3.5 - 5.1 mEq/L   Chloride 99  96 - 112 mEq/L   CO2 25  19 - 32 mEq/L   Glucose, Bld 88  70 - 99 mg/dL   BUN 12  6 - 23 mg/dL   Creatinine, Ser 4.09  0.50 - 1.10 mg/dL   Calcium 9.6  8.4 - 81.1 mg/dL   GFR calc non Af Amer >90  >90 mL/min   GFR calc Af Amer >90  >90 mL/min       1. Sinusitis       MDM  5 PM patient seen and evaluated. Patient no acute distress. Patient  with normal respirations and O2 sats. Patient appears calm with no respiratory distress.        Angus Seller, [place] 07/16/11 2211

## 2011-07-17 NOTE — ED Provider Notes (Signed)
Medical screening examination/treatment/procedure(s) were performed by non-physician practitioner and as supervising physician I was immediately available for consultation/collaboration.  Benny Henrie, MD 07/17/11 0905 

## 2011-07-19 ENCOUNTER — Emergency Department (HOSPITAL_COMMUNITY)
Admission: EM | Admit: 2011-07-19 | Discharge: 2011-07-19 | Disposition: A | Discharge status: Home / Self Care | Payer: Self-pay | Source: Home / Self Care | Attending: Emergency Medicine | Admitting: Emergency Medicine

## 2011-07-19 DIAGNOSIS — L0292 Furuncle, unspecified: Secondary | ICD-10-CM

## 2011-07-19 DIAGNOSIS — L02429 Furuncle of limb, unspecified: Secondary | ICD-10-CM

## 2011-07-19 MED ORDER — MUPIROCIN 2 % EX OINT: TOPICAL_OINTMENT | Freq: Three times a day (TID) | CUTANEOUS | Status: DC

## 2011-07-19 MED ORDER — DOXYCYCLINE HYCLATE 100 MG PO CAPS: 100.0000 mg | ORAL_CAPSULE | Freq: Two times a day (BID) | ORAL | Status: AC

## 2011-07-19 MED ORDER — MUPIROCIN 2 % EX OINT: TOPICAL_OINTMENT | Freq: Three times a day (TID) | CUTANEOUS | Status: AC

## 2011-07-19 NOTE — ED Notes (Signed)
Pt states she was diagnosed 1 month ago with sarcoidosis and given prednisone, completed pack 2 wks ago, concerned she may need ongoing treatment, also co lesion to left thigh, right face and right axilla starting past few days

## 2011-07-19 NOTE — ED Provider Notes (Signed)
History     CSN: [id number]  Arrival date & time 07/19/11  1449   First MD Initiated Contact with Patient 07/19/11 1459      Chief Complaint  Patient presents with  . Re-evaluation    (Consider location/radiation/quality/duration/timing/severity/associated sxs/prior treatment) HPI Comments: Patient returned from the beach and is describing that started having this tenderness and redness in the inner aspect of her left eye is also one on her right face and also a new one on the right axilla. They're little bumps, red and tender. The one on my right armpit is losing yellowish discharge. And is getting larger.  Patient denies any constitutional symptoms such as fevers, generalized malaise, arthralgias or myalgias.  The history is provided by the patient.    No past medical history on file.  Past Surgical History  Procedure Date  . Uterine didelphis     No family history on file.  History  Substance Use Topics  . Smoking status: Former Games developer  . Smokeless tobacco: Not on file  . Alcohol Use: No    OB History    Grav Para Term Preterm Abortions TAB SAB Ect Mult Living                  Review of Systems  Constitutional: Negative for activity change and appetite change.  Skin: Positive for rash and wound.    Allergies  Review of patient's allergies indicates no known allergies.  Home Medications   Current Outpatient Rx  Name Route Sig Dispense Refill  . AMOXICILLIN 500 MG PO CAPS Oral Take 1 capsule (500 mg total) by mouth 3 (three) times daily. 30 capsule 0  . CETIRIZINE-PSEUDOEPHEDRINE ER 5-120 MG PO TB12 Oral Take 1 tablet by mouth daily. 30 tablet 0  . DOXYCYCLINE HYCLATE 100 MG PO CAPS Oral Take 1 capsule (100 mg total) by mouth 2 (two) times daily. 20 capsule 0  . GUAIFENESIN ER 600 MG PO TB12 Oral Take 2 tablets (1,200 mg total) by mouth 2 (two) times daily. 30 tablet 0  . MUPIROCIN 2 % EX OINT Topical Apply topically 3 (three) times daily. X 7 days 22 g 0      BP 127/83  Pulse 86  Temp 99 F (37.2 C) (Oral)  Resp 16  SpO2 100%  Physical Exam  Nursing note and vitals reviewed. Constitutional: Vital signs are normal. She appears well-developed and well-nourished.  Non-toxic appearance. She does not have a sickly appearance. She does not appear ill. No distress.    Neurological: She is alert.  Skin: There is erythema.    ED Course  Procedures (including critical care time)  Labs Reviewed - No data to display No results found.   1. Furunculosis of axilla   2. Carbuncle and furuncle       MDM  F from colds and carbuncles on 3 depicted areas. Patient was prescribed a local antibiotic to use for the next 48 hours and if no improvement to commit with doxycycline cycle. Patient was recently at the beach and returned when she started noticing this localize infections. I have addressed that she needs to talk to her provider about ongoing steroid treatment, for her ongoing sarcoidosis.        Jimmie Molly, MD 07/19/11 1806

## 2011-08-21 ENCOUNTER — Encounter: Payer: Self-pay | Admitting: Internal Medicine

## 2011-08-21 ENCOUNTER — Ambulatory Visit (INDEPENDENT_AMBULATORY_CARE_PROVIDER_SITE_OTHER): Payer: Self-pay | Admitting: Internal Medicine

## 2011-08-21 VITALS — BP 102/78 | HR 86 | Temp 98.1°F | Ht 62.5 in | Wt 123.2 lb

## 2011-08-21 DIAGNOSIS — J310 Chronic rhinitis: Secondary | ICD-10-CM

## 2011-08-21 DIAGNOSIS — D869 Sarcoidosis, unspecified: Secondary | ICD-10-CM

## 2011-08-21 MED ORDER — PREDNISONE 10 MG PO TABS: ORAL_TABLET | ORAL | Status: DC

## 2011-08-21 MED ORDER — AMOXICILLIN-POT CLAVULANATE 875-125 MG PO TABS: 1.0000 | ORAL_TABLET | Freq: Two times a day (BID) | ORAL | Status: AC

## 2011-08-21 NOTE — Patient Instructions (Addendum)
augmentin x 10 days one twice daily (yogurt for lunch)  Nasal saline irrigation and stop sinex unless you have to one side to sleep x 5 days only  Prednisone 10 mg x 2 each until better then one each am  Please schedule a follow up office visit in 6 weeks, call sooner if needed with cxr.  I do recommend you see an MD eye doctor for a sarcoid eye exam.

## 2011-08-21 NOTE — Progress Notes (Signed)
  Subjective:    Patient ID: Katelyn Stevens, female    DOB: Mar 21, 1980   MRN: [id number]  HPI  71 yobf quit smoking 2012 then starting November 2012 onset of nasal congestion then cough and sob in winter of 2013 and then lymph node swelling spring 2013 and referred 08/21/2011 to pulmonary clinic by her brother.  08/21/2011 1st pulmonary eval cc can't breath through nose and green nasal drainage, much better p prednisone x 2 weeks, much worse off it.  Min arthralgias, min sob better on prednisone, no occular complaints or rash.  Sleeping ok without nocturnal  or early am exacerbation  of respiratory  c/o's or need for noct saba. Also denies any obvious fluctuation of symptoms with weather or environmental changes or other aggravating or alleviating factors except as outlined above    Review of Systems  Constitutional: Positive for unexpected weight change. Negative for fever.  HENT: Positive for congestion and sinus pressure. Negative for ear pain, nosebleeds, sore throat, rhinorrhea, sneezing, trouble swallowing, dental problem and postnasal drip.   Eyes: Negative for redness and itching.  Respiratory: Positive for cough and shortness of breath. Negative for chest tightness and wheezing.   Cardiovascular: Negative for palpitations and leg swelling.  Gastrointestinal: Negative for nausea and vomiting.  Genitourinary: Negative for dysuria.  Musculoskeletal: Negative for joint swelling.  Skin: Negative for rash.  Neurological: Negative for headaches.  Hematological: Does not bruise/bleed easily.  Psychiatric/Behavioral: Negative for dysphoric mood. The patient is not nervous/anxious.        Objective:   Physical Exam amb bf nad  Wt Readings from Last 3 Encounters:  08/21/11 123 lb 3.2 oz (55.883 kg)   HEENT: nl dentition, nd orophanx. Nl external ear canals without cough reflex Severe bilateral non specific turbinate edema with MP secretions, no polyps, no cyanosis  NECK :  without JVD/  TM/ nl carotid upstrokes bilaterally/ Pos shoddy nodes preauricular L > R    LUNGS: no acc muscle use, clear to A and P bilaterally without cough on insp or exp maneuvers   CV:  RRR  no s3 or murmur or increase in P2, no edema   ABD:  soft and nontender with nl excursion in the supine position. No bruits or organomegaly, bowel sounds nl  MS:  warm without deformities, calf tenderness, cyanosis or clubbing  SKIN: warm and dry without lesions    NEURO:  alert, approp, no deficits     06/14/2001 There is mediastinal and hilar adenopathy. Adenopathy is noted  in the upper abdomen, portocaval and retroperitoneum. Bilateral  lung parenchyma there is interstitial prominence with reticular  micronodular prominence. Findings may be due to sarcoid,  lymphoproliferative disease or lymphoma. Further evaluation is  recommended.      Assessment & Plan:

## 2011-08-22 DIAGNOSIS — D869 Sarcoidosis, unspecified: Secondary | ICD-10-CM | POA: Insufficient documentation

## 2011-08-22 DIAGNOSIS — J310 Chronic rhinitis: Secondary | ICD-10-CM | POA: Insufficient documentation

## 2011-08-22 NOTE — Assessment & Plan Note (Signed)
The ddx includes lymphoma and other granulomatous dz but based on fm hx and classic hx of nasal symptoms with adenopathy this is almost certainly sarcoid  Discussed in detail all the  indications, usual  risks and alternatives  relative to the benefits with patient who agrees to proceed with trial of relatively low dose prednisone.  The goal with a chronic steroid dependent illness is always arriving at the lowest effective dose that controls the disease/symptoms and not accepting a set "formula" which is based on statistics or guidelines that don't always take into account patient  variability or the natural hx of the dz in every individual patient, which may well vary over time.  For now therefore I recommend the patient maintain ceiling of 20 mg and floor of 10 mg for now  /See instructions for specific recommendations which were reviewed directly with the patient who was given a copy with highlighter outlining the key components.

## 2011-08-22 NOTE — Assessment & Plan Note (Signed)
Probably also sarcoid related ? Complicated by sinusitis > rx x 10 days augmentin then sinus ct if not better

## 2011-10-07 ENCOUNTER — Ambulatory Visit (INDEPENDENT_AMBULATORY_CARE_PROVIDER_SITE_OTHER)
Admission: RE | Admit: 2011-10-07 | Discharge: 2011-10-07 | Disposition: A | Discharge status: Home / Self Care | Payer: Self-pay | Source: Ambulatory Visit | Attending: Internal Medicine | Admitting: Internal Medicine

## 2011-10-07 ENCOUNTER — Encounter: Payer: Self-pay | Admitting: Internal Medicine

## 2011-10-07 ENCOUNTER — Ambulatory Visit (INDEPENDENT_AMBULATORY_CARE_PROVIDER_SITE_OTHER): Payer: Self-pay | Admitting: Internal Medicine

## 2011-10-07 VITALS — BP 102/78 | HR 101 | Temp 98.7°F | Ht 62.5 in | Wt 126.4 lb

## 2011-10-07 DIAGNOSIS — D869 Sarcoidosis, unspecified: Secondary | ICD-10-CM

## 2011-10-07 MED ORDER — PREDNISONE (PAK) 5 MG PO TABS: ORAL_TABLET | ORAL | Status: DC

## 2011-10-07 NOTE — Patient Instructions (Addendum)
Prednisone is 5 mg daily x 2 weeks then if doing well try 5 mg every other day and if at any point your symptoms recur, resume the previous dose  Sarcoidosis is a benign inflammatory condition caused by  The  immune system being too revved up like a thermostat on your furnace that's partially  stuck causing arthitis, rash, short of breath and cough and vision issues.  It typically burns itself out in 75% of patients by the end of 3 years with little to indicate that we really change the natural course of the disease by aggressive treatments intended to alter it.  Treatment is generally reserved for patients with major symptoms we can attribute to sarcoid or if a vital organ (like the eye or kidney or nervous system) becomes affected.    Please schedule a follow up office visit in 6 weeks, call sooner if needed

## 2011-10-07 NOTE — Progress Notes (Signed)
  Subjective:    Patient ID: Katelyn Stevens, female    DOB: 04/07/1980   MRN: [id number]  HPI  13 yobf quit smoking 2012 then starting November 2012 onset of nasal congestion then cough and sob in winter of 2013 and then lymph node swelling spring 2013 and referred 08/21/2011 to pulmonary clinic by her brother.  08/21/2011 1st pulmonary eval cc can't breath through nose and green nasal drainage, much better p prednisone x 2 weeks, much worse off it.  Min arthralgias, min sob better on prednisone, no occular complaints or rash. rec augmentin x 10 days one twice daily (yogurt for lunch) Nasal saline irrigation and stop sinex unless you have to one side to sleep x 5 days only Prednisone 10 mg x 2 each until better then one each am Please schedule a follow up office visit in 6 weeks, call sooner if needed with cxr. I do recommend you see an MD eye doctor for a sarcoid eye exam.    10/07/2011 f/u ov/Wert cc trouble sleeping and cramps in fingers, still on prednisone 20 mg per day afraid to decrease as doing so well. No cp, sob, cough, rash, and lymph nodes appear to be shrinking in neck  Sleeping ok without nocturnal  or early am exacerbation  of respiratory  c/o's or need for noct saba. Also denies any obvious fluctuation of symptoms with weather or environmental changes or other aggravating or alleviating factors except as outlined above            Objective:   Physical Exam amb bf nad  Wt 10/07/2011  126 Wt Readings from Last 3 Encounters:  08/21/11 123 lb 3.2 oz (55.883 kg)   HEENT: nl dentition, nd orophanx. Nl external ear canals without cough reflex Severe bilateral non specific turbinate edema with MP secretions, no polyps, no cyanosis  NECK :  without JVD/ TM/ nl carotid upstrokes bilaterally/ Pos shoddy nodes preauricular L > R    LUNGS: no acc muscle use, clear to A and P bilaterally without cough on insp or exp maneuvers   CV:  RRR  no s3 or murmur or increase in P2, no edema    ABD:  soft and nontender with nl excursion in the supine position. No bruits or organomegaly, bowel sounds nl  MS:  warm without deformities, calf tenderness, cyanosis or clubbing  SKIN: warm and dry without lesions    NEURO:  alert, approp, no deficits    CXR  10/07/2011 : Def reduction in adenopathy esp noted on lateral view         Assessment & Plan:

## 2011-10-09 NOTE — Assessment & Plan Note (Signed)
-   Clinical dx only with symptoms starting ? Nov 2012    - Chronic prednisone rx 08/22/2011 >>>  Most certainly this is sarcoid as reflected in how good she feels on a relatively low dose, short course of prednisone  The goal with a chronic steroid dependent illness is always arriving at the lowest effective dose that controls the disease/symptoms and not accepting a set "formula" which is based on statistics or guidelines that don't always take into account patient  variability or the natural hx of the dz in every individual patient, which may well vary over time.  For now therefore I recommend the patient maintain  Ceiling of 20 mg and floor of 5 mg daily  See instructions for specific recommendations which were reviewed directly with the patient who was given a copy with highlighter outlining the key components.

## 2011-11-11 ENCOUNTER — Encounter (HOSPITAL_COMMUNITY): Payer: Self-pay

## 2011-11-11 ENCOUNTER — Emergency Department (HOSPITAL_COMMUNITY)
Admission: EM | Admit: 2011-11-11 | Discharge: 2011-11-11 | Disposition: A | Discharge status: Home / Self Care | Payer: Self-pay | Attending: Emergency Medicine | Admitting: Emergency Medicine

## 2011-11-11 DIAGNOSIS — IMO0002 Reserved for concepts with insufficient information to code with codable children: Secondary | ICD-10-CM | POA: Insufficient documentation

## 2011-11-11 DIAGNOSIS — D869 Sarcoidosis, unspecified: Secondary | ICD-10-CM | POA: Insufficient documentation

## 2011-11-11 DIAGNOSIS — Z87891 Personal history of nicotine dependence: Secondary | ICD-10-CM | POA: Insufficient documentation

## 2011-11-11 DIAGNOSIS — H109 Unspecified conjunctivitis: Secondary | ICD-10-CM | POA: Insufficient documentation

## 2011-11-11 HISTORY — DX: Sarcoidosis, unspecified: D86.9

## 2011-11-11 MED ORDER — TOBRAMYCIN 0.3 % OP SOLN: 1.0000 [drp] | OPHTHALMIC | Status: DC

## 2011-11-11 NOTE — ED Provider Notes (Signed)
History  Scribed for Carleene Cooper III, MD, the patient was seen in room TR06C/TR06C. This chart was scribed by Candelaria Stagers. The patient's care started at 12:07 PM   CSN: [id number]  Arrival date & time 11/11/11  1103   First MD Initiated Contact with Patient 11/11/11 1205      Chief Complaint  Patient presents with  . Nasal Congestion  . Conjunctivitis     The history is provided by the patient. No language interpreter was used.   Katelyn Stevens is a 31 y.o. female who presents to the Emergency Department complaining of redness and drainage to the right eye that started yesterday.  She is also experiencing nasal congestion.  She denies cough, ear ache, sore throat, or fever.  She does not recall having ill contacts.  Pt has h/o sarcoidosis and takes prednisone daily.    Past Medical History  Diagnosis Date  . Sarcoidosis     Past Surgical History  Procedure Date  . Uterine didelphis     Family History  Problem Relation Age of Onset  . Breast cancer Mother   . Liver cancer Maternal Grandmother     History  Substance Use Topics  . Smoking status: Former Smoker -- 0.5 packs/day for 6 years    Types: Cigarettes    Quit date: 08/07/2010  . Smokeless tobacco: Never Used  . Alcohol Use: Yes     Comment: 2 glasses a week     OB History    Grav Para Term Preterm Abortions TAB SAB Ect Mult Living                  Review of Systems  Constitutional: Negative for fever.  HENT: Positive for congestion and rhinorrhea. Negative for sore throat.   Eyes: Positive for discharge and redness.  Respiratory: Negative for cough.   Gastrointestinal: Negative for vomiting and diarrhea.  Genitourinary: Negative for difficulty urinating and menstrual problem.  Skin: Negative for rash.  Neurological: Negative for syncope.  All other systems reviewed and are negative.    Allergies  Review of patient's allergies indicates no known allergies.  Home Medications   Current  Outpatient Rx  Name  Route  Sig  Dispense  Refill  . PREDNISONE 5 MG PO TABS   Oral   Take 5 mg by mouth daily.           BP 106/76  Pulse 82  Temp 97.7 F (36.5 C) (Oral)  Resp 16  SpO2 96%  LMP 11/11/2011  Physical Exam  Nursing note and vitals reviewed. Constitutional: She is oriented to person, place, and time. She appears well-developed and well-nourished. No distress.  HENT:  Head: Normocephalic and atraumatic.  Right Ear: External ear normal.  Left Ear: External ear normal.  Eyes: EOM are normal.       Redness of the bulbar and palpebral conjunctiva of the right eye.  Serous drainage from the right eye.    Neck: Neck supple. No tracheal deviation present.  Cardiovascular: Normal rate.   Pulmonary/Chest: Effort normal. No respiratory distress.  Abdominal: She exhibits no distension.  Musculoskeletal: Normal range of motion.  Neurological: She is alert and oriented to person, place, and time. No sensory deficit.  Skin: Skin is dry.  Psychiatric: She has a normal mood and affect. Her behavior is normal.    ED Course  Procedures   DIAGNOSTIC STUDIES: Oxygen Saturation is 96% on room air, normal by my interpretation.    COORDINATION OF  CARE:  12:11 PM Will treat conjunctivitis with antibiotic eye drops.  Pt understands and agrees.      Rx Tobrex ophthalmic drops q4h x 3 days.  No work today as is communicable.    1. Conjunctivitis of right eye    I personally performed the services described in this documentation, which was scribed in my presence. The recorded information has been reviewed and considered.  Osvaldo Human, MD      Carleene Cooper III, MD 11/11/11 [phone]

## 2011-11-11 NOTE — ED Notes (Signed)
Pt states she thinks she has conjunctivitis in R eye due to redness and drainage.  Pt also c/o nasal congestion.  Denies cough, fever.

## 2011-11-18 ENCOUNTER — Encounter: Payer: Self-pay | Admitting: Internal Medicine

## 2011-11-18 ENCOUNTER — Ambulatory Visit (INDEPENDENT_AMBULATORY_CARE_PROVIDER_SITE_OTHER): Payer: Self-pay | Admitting: Internal Medicine

## 2011-11-18 VITALS — BP 100/78 | HR 64 | Temp 98.4°F | Ht 62.5 in | Wt 127.0 lb

## 2011-11-18 DIAGNOSIS — J310 Chronic rhinitis: Secondary | ICD-10-CM

## 2011-11-18 DIAGNOSIS — D869 Sarcoidosis, unspecified: Secondary | ICD-10-CM

## 2011-11-18 MED ORDER — AMOXICILLIN-POT CLAVULANATE 875-125 MG PO TABS: 1.0000 | ORAL_TABLET | Freq: Two times a day (BID) | ORAL | Status: DC

## 2011-11-18 MED ORDER — MOMETASONE FUROATE 50 MCG/ACT NA SUSP: 2.0000 | Freq: Every day | NASAL | Status: DC

## 2011-11-18 NOTE — Patient Instructions (Addendum)
I emphasized that nasal steroids (nasonex)have no immediate benefit in terms of improving symptoms.  To help them reached the target tissue, the patient should use nostrilla  two puffs every 12 hours applied one min before using the nasal steroids.  Afrin should be stopped after no more than 5 days.  If the symptoms worsen, Afrin can be restarted after 5 days off of therapy to prevent rebound congestion from overuse of Afrin.  I also emphasized that in no way are nasal steroids a concern in terms of "addiction".   augmentin x 10 days one twice daily (yogurt for lunch)  Prednisone 20 mg daily  until better then 10 mg per day x 5 days and then 5 mg daily  Nasonex two puffs twice daily taper to one at bedtime if better into each nostril  I do recommend you see an MD eye doctor for a sarcoid eye exam- see coordinator for referral  Please schedule a follow up office visit in 6 weeks, call sooner if needed with cxr

## 2011-11-18 NOTE — Assessment & Plan Note (Signed)
-   Clinical dx only with symptoms starting ? Nov 2012    - Chronic prednisone rx 08/22/2011 >>>    - Refer to opthamology 11/18/2011 >>>  The goal with a chronic steroid dependent illness is always arriving at the lowest effective dose that controls the disease/symptoms and not accepting a set "formula" which is based on statistics or guidelines that don't always take into account patient  variability or the natural hx of the dz in every individual patient, which may well vary over time.  For now therefore I recommend the patient maintain  A ceiling of 20 and a floor of 5 mg qod for now

## 2011-11-18 NOTE — Assessment & Plan Note (Signed)
-   augmentin x 10 days rx 08/22/2011  And 11/18/2011    - Add nasal steroids 11/18/2011   If no better next step is sinus ct

## 2011-11-18 NOTE — Progress Notes (Signed)
  Subjective:    Patient ID: Katelyn Stevens, female    DOB: 02-10-1980   MRN: [id number]  HPI  27 yobf quit smoking 2012 then starting November 2012 onset of nasal congestion then cough and sob in winter of 2013 and then lymph node swelling spring 2013 and referred 08/21/2011 to pulmonary clinic by her brother.  08/21/2011 1st pulmonary eval cc can't breath through nose and green nasal drainage, much better p prednisone x 2 weeks, much worse off it.  Min arthralgias, min sob better on prednisone, no occular complaints or rash. rec augmentin x 10 days one twice daily (yogurt for lunch) Nasal saline irrigation and stop sinex unless you have to one side to sleep x 5 days only Prednisone 10 mg x 2 each until better then one each am Please schedule a follow up office visit in 6 weeks, call sooner if needed with cxr. I do recommend you see an MD eye doctor for a sarcoid eye exam > did not go     10/07/2011 f/u ov/Katelyn Stevens cc trouble sleeping and cramps in fingers, still on prednisone 20 mg per day afraid to decrease as doing so well. No cp, sob, cough, rash, and lymph nodes appear to be shrinking in neck Prednisone is 5 mg daily x 2 weeks then if doing well try 5 mg every other day and if at any point your symptoms recur, resume the previous dose Sarcoidosis pathophysiology and rx reviewed with pt     11/18/2011 f/u ov/Katelyn Stevens  reduced to 5 mg one daily and ok for a week, then nasal congestion flared then increased to 7.5  Then 10 mg per day x 3 days and no better with afrin.  No sob and No obvious daytime variabilty or assoc chronic cough or cp or chest tightness, subjective wheeze overt   hb symptoms. No unusual exp hx    Sleeping ok without nocturnal  or early am exacerbation  of respiratory  c/o's or need for noct saba. Also denies any obvious fluctuation of symptoms with weather or environmental changes or other aggravating or alleviating factors except as outlined above                   Objective:   Physical Exam amb bf nad  Wt 10/07/2011  126 vs 11/18/2011  Wt Readings from Last 3 Encounters:  08/21/11 123 lb 3.2 oz (55.883 kg)   HEENT: nl dentition, nd orophanx. Nl external ear canals without cough reflex Severe bilateral non specific turbinate edema with MP secretions, no polyps, no cyanosis  NECK :  without JVD/ TM/ nl carotid upstrokes bilaterally/ Pos shoddy nodes preauricular L > R    LUNGS: no acc muscle use, clear to A and P bilaterally without cough on insp or exp maneuvers   CV:  RRR  no s3 or murmur or increase in P2, no edema   ABD:  soft and nontender with nl excursion in the supine position. No bruits or organomegaly, bowel sounds nl  MS:  warm without deformities, calf tenderness, cyanosis or clubbing  SKIN: warm and dry without lesions        CXR  10/07/2011 : Def reduction in adenopathy esp noted on lateral view         Assessment & Plan:

## 2011-11-19 ENCOUNTER — Other Ambulatory Visit: Payer: Self-pay | Admitting: Internal Medicine

## 2011-12-03 ENCOUNTER — Telehealth: Payer: Self-pay | Admitting: Internal Medicine

## 2011-12-03 NOTE — Telephone Encounter (Signed)
Pt advised and will call if continues to have issues. Carron Curie, CMA

## 2011-12-03 NOTE — Telephone Encounter (Signed)
[email or web address] Majchrzak, CMA  

## 2011-12-03 NOTE — Telephone Encounter (Signed)
The low specific gravity suggests the reason she's getting up at night is related to amount of fluid she drinks (too much)   If doesn't have a primary and continues to have problems should come see Tammy but in any case can't do anything further over the phone

## 2011-12-03 NOTE — Telephone Encounter (Signed)
Called, spoke with pt.  States yesterday she has a urine test done which came back with high levels of specific gravity at 1.03 per pt and high levels of urobilinogen.  Pt reports she has polyuria qhs x 2 wks.  Urine is strong at times but unsure if this is from the medications she is on.  Denies burning or pain when urinating or back pain.  Pt states she doesn't have a PCP or OB/GYN.  I rec she go to Urgent Care to be tx as MW is her pulmonary dr.  Rock Nephew then states "Why can't he treat me?  Can this be a side effect of the prednisone?"  Advised pt MW is not her PCP but would send msg to see if this could be a side effect of the prednisone.  Dr. Sherene Sires, pls advise.  Thank you.

## 2011-12-03 NOTE — Telephone Encounter (Signed)
[email or web address]  

## 2012-01-02 ENCOUNTER — Ambulatory Visit (INDEPENDENT_AMBULATORY_CARE_PROVIDER_SITE_OTHER): Payer: Self-pay | Admitting: Internal Medicine

## 2012-01-02 ENCOUNTER — Ambulatory Visit (INDEPENDENT_AMBULATORY_CARE_PROVIDER_SITE_OTHER)
Admission: RE | Admit: 2012-01-02 | Discharge: 2012-01-02 | Disposition: A | Discharge status: Home / Self Care | Payer: Self-pay | Source: Ambulatory Visit | Attending: Internal Medicine | Admitting: Internal Medicine

## 2012-01-02 ENCOUNTER — Encounter: Payer: Self-pay | Admitting: Internal Medicine

## 2012-01-02 VITALS — BP 110/68 | HR 95 | Temp 98.7°F | Ht 62.5 in | Wt 137.6 lb

## 2012-01-02 DIAGNOSIS — D869 Sarcoidosis, unspecified: Secondary | ICD-10-CM

## 2012-01-02 DIAGNOSIS — J310 Chronic rhinitis: Secondary | ICD-10-CM

## 2012-01-02 MED ORDER — MOMETASONE FUROATE 50 MCG/ACT NA SUSP: 2.0000 | Freq: Every day | NASAL | Status: DC

## 2012-01-02 NOTE — Assessment & Plan Note (Signed)
-   Clinical dx only with symptoms starting ? Nov 2012    - Chronic prednisone rx 08/22/2011 >>>    - Refer to opthamology 11/18/2011 >>> neg eval  No evidence of pulmonary or systemic symptoms  The goal with a chronic steroid dependent illness is always arriving at the lowest effective dose that controls the disease/symptoms and not accepting a set "formula" which is based on statistics or guidelines that don't always take into account patient  variability or the natural hx of the dz in every individual patient, which may well vary over time.  For now therefore I recommend the patient maintain  A ceiling of 20 and a floor of 5 mg daily

## 2012-01-02 NOTE — Progress Notes (Deleted)
  Subjective:    Patient ID: Katelyn Stevens, female    DOB: 06/08/1980   MRN: [id number]  HPI  51 yobf quit smoking 2012 then starting November 2012 onset of nasal congestion then cough and sob in winter of 2013 and then lymph node swelling spring 2013 and referred 08/21/2011 to pulmonary clinic by her brother.  08/21/2011 1st pulmonary eval cc can't breath through nose and green nasal drainage, much better p prednisone x 2 weeks, much worse off it.  Min arthralgias, min sob better on prednisone, no occular complaints or rash. rec augmentin x 10 days one twice daily (yogurt for lunch) Nasal saline irrigation and stop sinex unless you have to one side to sleep x 5 days only Prednisone 10 mg x 2 each until better then one each am Please schedule a follow up office visit in 6 weeks, call sooner if needed with cxr. I do recommend you see an MD eye doctor for a sarcoid eye exam > did not go     10/07/2011 f/u ov/Esvin Hnat cc trouble sleeping and cramps in fingers, still on prednisone 20 mg per day afraid to decrease as doing so well. No cp, sob, cough, rash, and lymph nodes appear to be shrinking in neck Prednisone is 5 mg daily x 2 weeks then if doing well try 5 mg every other day and if at any point your symptoms recur, resume the previous dose Sarcoidosis pathophysiology and rx reviewed with pt     01/02/2012 f/u ov/Raybon Conard  reduced to 5 mg one daily and ok for a week, then nasal congestion flared then increased to 7.5  Then 10 mg per day x 3 days and no better with afrin.  No sob and No obvious daytime variabilty or assoc chronic cough or cp or chest tightness, subjective wheeze overt   hb symptoms. No unusual exp hx    Sleeping ok without nocturnal  or early am exacerbation  of respiratory  c/o's or need for noct saba. Also denies any obvious fluctuation of symptoms with weather or environmental changes or other aggravating or alleviating factors except as outlined above                   Objective:   Physical Exam amb bf nad  Wt 10/07/2011  126 vs 01/02/2012  Wt Readings from Last 3 Encounters:  08/21/11 123 lb 3.2 oz (55.883 kg)   HEENT: nl dentition, nd orophanx. Nl external ear canals without cough reflex Severe bilateral non specific turbinate edema with MP secretions, no polyps, no cyanosis  NECK :  without JVD/ TM/ nl carotid upstrokes bilaterally/ Pos shoddy nodes preauricular L > R    LUNGS: no acc muscle use, clear to A and P bilaterally without cough on insp or exp maneuvers   CV:  RRR  no s3 or murmur or increase in P2, no edema   ABD:  soft and nontender with nl excursion in the supine position. No bruits or organomegaly, bowel sounds nl  MS:  warm without deformities, calf tenderness, cyanosis or clubbing  SKIN: warm and dry without lesions        CXR  10/07/2011 : Def reduction in adenopathy esp noted on lateral view         Assessment & Plan:

## 2012-01-02 NOTE — Progress Notes (Deleted)
  Subjective:    Patient ID: Katelyn Stevens, female    DOB: 1980/08/30   MRN: [id number]  HPI  57 yobf quit smoking 2012 then starting November 2012 onset of nasal congestion then cough and sob in winter of 2013 and then lymph node swelling spring 2013 and referred 08/21/2011 to pulmonary clinic by her brother.  08/21/2011 1st pulmonary eval cc can't breath through nose and green nasal drainage, much better p prednisone x 2 weeks, much worse off it.  Min arthralgias, min sob better on prednisone, no occular complaints or rash. rec augmentin x 10 days one twice daily (yogurt for lunch) Nasal saline irrigation and stop sinex unless you have to one side to sleep x 5 days only Prednisone 10 mg x 2 each until better then one each am Please schedule a follow up office visit in 6 weeks, call sooner if needed with cxr. I do recommend you see an MD eye doctor for a sarcoid eye exam > did not go     10/07/2011 f/u ov/Katelyn Stevens cc trouble sleeping and cramps in fingers, still on prednisone 20 mg per day afraid to decrease as doing so well. No cp, sob, cough, rash, and lymph nodes appear to be shrinking in neck Prednisone is 5 mg daily x 2 weeks then if doing well try 5 mg every other day and if at any point your symptoms recur, resume the previous dose Sarcoidosis pathophysiology and rx reviewed with pt     01/02/2012 f/u ov/Katelyn Stevens  reduced to 5 mg one daily and ok for a week, then nasal congestion flared then increased to 7.5  Then 10 mg per day x 3 days and no better with afrin.          No sob and No obvious daytime variabilty or assoc chronic cough or cp or chest tightness, subjective wheeze overt   hb symptoms. No unusual exp hx    Sleeping ok without nocturnal  or early am exacerbation  of respiratory  c/o's or need for noct saba. Also denies any obvious fluctuation of symptoms with weather or environmental changes or other aggravating or alleviating factors except as outlined above                 Objective:   Physical Exam amb bf nad  Wt 10/07/2011  126 vs 01/02/2012  137  Wt Readings from Last 3 Encounters:  08/21/11 123 lb 3.2 oz (55.883 kg)   HEENT: nl dentition, nd orophanx. Nl external ear canals without cough reflex Severe bilateral non specific turbinate edema with MP secretions, no polyps, no cyanosis  NECK :  without JVD/ TM/ nl carotid upstrokes bilaterally/ Pos shoddy nodes preauricular L > R    LUNGS: no acc muscle use, clear to A and P bilaterally without cough on insp or exp maneuvers   CV:  RRR  no s3 or murmur or increase in P2, no edema   ABD:  soft and nontender with nl excursion in the supine position. No bruits or organomegaly, bowel sounds nl  MS:  warm without deformities, calf tenderness, cyanosis or clubbing  SKIN: warm and dry without lesions        CXR  10/07/2011 : Def reduction in adenopathy esp noted on lateral view         Assessment & Plan:

## 2012-01-02 NOTE — Patient Instructions (Addendum)
I emphasized that nasal steroids (nasonex)have no immediate benefit in terms of improving symptoms.  To help them reached the target tissue, the patient should use nostrilla  two puffs every 12 hours applied one min before using the nasal steroids.  Afrin should be stopped after no more than 5 days.  If the symptoms worsen, Afrin can be restarted after 5 days off of therapy to prevent rebound congestion from overuse of Afrin.  I also emphasized that in no way are nasal steroids a concern in terms of "addiction".    Prednisone 20 mg daily  until better then 10 mg per day x one week  and then 5 mg daily x one week then 5 mg every other day seeking the lowest dose that works  Nasonex two puffs twice daily taper to one at bedtime if better into each nostril  Please remember to go to the  x-ray department downstairs for your tests - we will call you with the results when they are available.     Please schedule a follow up visit in 3 months but call sooner if needed

## 2012-01-02 NOTE — Assessment & Plan Note (Signed)
-   augmentin x 10 days rx 08/22/2011  And 11/18/2011    - Add nasal steroids 11/18/2011 > marked improvement 01/02/2012 > continue topical rx only

## 2012-01-02 NOTE — Progress Notes (Signed)
Subjective:    Patient ID: Katelyn Stevens, female    DOB: 06-Mar-1980   MRN: [id number]  HPI  73 yobf quit smoking 2012 then starting November 2012 onset of nasal congestion then cough and sob in winter of 2013 and then lymph node swelling spring 2013 and referred 08/21/2011 to pulmonary clinic by her brother.  08/21/2011 1st pulmonary eval cc can't breath through nose and green nasal drainage, much better p prednisone x 2 weeks, much worse off it.  Min arthralgias, min sob better on prednisone, no occular complaints or rash. rec augmentin x 10 days one twice daily (yogurt for lunch) Nasal saline irrigation and stop sinex unless you have to one side to sleep x 5 days only Prednisone 10 mg x 2 each until better then one each am Please schedule a follow up office visit in 6 weeks, call sooner if needed with cxr. I do recommend you see an MD eye doctor for a sarcoid eye exam > did not go     10/07/2011 f/u ov/Wert cc trouble sleeping and cramps in fingers, still on prednisone 20 mg per day afraid to decrease as doing so well. No cp, sob, cough, rash, and lymph nodes appear to be shrinking in neck Prednisone is 5 mg daily x 2 weeks then if doing well try 5 mg every other day and if at any point your symptoms recur, resume the previous dose Sarcoidosis pathophysiology and rx reviewed with pt     11/18/2011 f/u ov/Wert  reduced to 5 mg one daily and ok for a week, then nasal congestion flared then increased to 7.5  Then 10 mg per day x 3 days and no better with afrin. rec I emphasized that nasal steroids (nasonex)have no immediate benefit    Augmentin x 10 days one twice daily (yogurt for lunch) Prednisone 20 mg daily  until better then 10 mg per day x 5 days and then 5 mg daily Nasonex two puffs twice daily taper to one at bedtime if better into each nostril I do recommend you see an MD eye doctor for a sarcoid eye exam- > neg  01/02/2012 f/u ov/Wert cc much better  nasal symptoms  still on 10  mg of pred per day. No occular articular symptoms or rash.     No sob and No obvious daytime variabilty or assoc chronic cough or cp or chest tightness, subjective wheeze overt   hb symptoms. No unusual exp hx    Sleeping ok without nocturnal  or early am exacerbation  of respiratory  c/o's or need for noct saba. Also denies any obvious fluctuation of symptoms with weather or environmental changes or other aggravating or alleviating factors except as outlined above   ROS  The following are not active complaints unless bolded sore throat, dysphagia, dental problems, itching, sneezing,  nasal congestion or excess/ purulent secretions, ear ache,   fever, chills, sweats, unintended wt loss, pleuritic or exertional cp, hemoptysis,  orthopnea pnd or leg swelling, presyncope, palpitations, heartburn, abdominal pain, anorexia, nausea, vomiting, diarrhea  or change in bowel or urinary habits, change in stools or urine, dysuria,hematuria,  rash, arthralgias, visual complaints, headache, numbness weakness or ataxia or problems with walking or coordination,  change in mood/affect or memory.                    Objective:   Physical Exam amb bf nad  Wt 10/07/2011  126 vs 133  01/02/2012  Wt Readings from Last  3 Encounters:  08/21/11 123 lb 3.2 oz (55.883 kg)   HEENT: nl dentition, nd orophanx. Nl external ear canals without cough reflex Severe bilateral non specific turbinate edema with no secretions, no polyps, no cyanosis  NECK :  without JVD/ TM/ nl carotid upstrokes bilaterally/ no palp nodes   LUNGS: no acc muscle use, clear to A and P bilaterally without cough on insp or exp maneuvers   CV:  RRR  no s3 or murmur or increase in P2, no edema   ABD:  soft and nontender with nl excursion in the supine position. No bruits or organomegaly, bowel sounds nl  MS:  warm without deformities, calf tenderness, cyanosis or clubbing  SKIN: warm and dry without lesions        CXR  01/02/2012 :   Mediastinal, hilar and pulmonary parenchymal changes of sarcoid, unchanged.           Assessment & Plan:

## 2012-01-23 ENCOUNTER — Telehealth: Payer: Self-pay | Admitting: Internal Medicine

## 2012-01-23 NOTE — Telephone Encounter (Signed)
Notes Recorded by Nyoka Cowden, MD on 01/02/2012 at 1:37 PM Call pt: Reviewed cxr and no acute change- overall trend is improving so no chang in [employer] with pt and notified of results per Dr. Sherene Sires. Pt verbalized understanding and denied any questions.

## 2012-02-27 ENCOUNTER — Telehealth: Payer: Self-pay | Admitting: Internal Medicine

## 2012-02-27 MED ORDER — PREDNISONE 5 MG PO TABS: 10.0000 mg | ORAL_TABLET | Freq: Every day | ORAL | Status: DC

## 2012-02-27 NOTE — Telephone Encounter (Signed)
Rx has been sent in, pt is aware. 

## 2012-04-01 ENCOUNTER — Ambulatory Visit (INDEPENDENT_AMBULATORY_CARE_PROVIDER_SITE_OTHER): Payer: Self-pay | Admitting: Internal Medicine

## 2012-04-01 ENCOUNTER — Encounter: Payer: Self-pay | Admitting: Internal Medicine

## 2012-04-01 VITALS — BP 108/68 | HR 81 | Temp 98.1°F | Ht 62.5 in | Wt 130.8 lb

## 2012-04-01 DIAGNOSIS — J310 Chronic rhinitis: Secondary | ICD-10-CM

## 2012-04-01 DIAGNOSIS — D869 Sarcoidosis, unspecified: Secondary | ICD-10-CM

## 2012-04-01 NOTE — Progress Notes (Signed)
Subjective:    Patient ID: Katelyn Stevens, female    DOB: 01/20/1980   MRN: [id number]  HPI  67 yobf quit smoking 2012 then starting November 2012 onset of nasal congestion then cough and sob in winter of 2013 and then lymph node swelling spring 2013 and referred 08/21/2011 to pulmonary clinic by her brother.  08/21/2011 1st pulmonary eval cc can't breath through nose and green nasal drainage, much better p prednisone x 2 weeks, much worse off it.  Min arthralgias, min sob better on prednisone, no occular complaints or rash. rec augmentin x 10 days one twice daily (yogurt for lunch) Nasal saline irrigation and stop sinex unless you have to one side to sleep x 5 days only Prednisone 10 mg x 2 each until better then one each am Please schedule a follow up office visit in 6 weeks, call sooner if needed with cxr. I do recommend you see an MD eye doctor for a sarcoid eye exam > did not go     10/07/2011 f/u ov/[name] cc trouble sleeping and cramps in fingers, still on prednisone 20 mg per day afraid to decrease as doing so well. No cp, sob, cough, rash, and lymph nodes appear to be shrinking in neck Prednisone is 5 mg daily x 2 weeks then if doing well try 5 mg every other day and if at any point your symptoms recur, resume the previous dose Sarcoidosis pathophysiology and rx reviewed with pt     11/18/2011 f/u ov/[name]  reduced to 5 mg one daily and ok for a week, then nasal congestion flared then increased to 7.5  Then 10 mg per day x 3 days and no better with afrin. rec I emphasized that nasal steroids (nasonex)have no immediate benefit    Augmentin x 10 days one twice daily (yogurt for lunch) Prednisone 20 mg daily  until better then 10 mg per day x 5 days and then 5 mg daily Nasonex two puffs twice daily taper to one at bedtime if better into each nostril I do recommend you see an MD eye doctor for a sarcoid eye exam- > neg  01/02/2012 f/u ov/[name] cc much better  nasal symptoms  still on 10  mg of pred per day. No occular articular symptoms or rash.  rec I emphasized that nasal steroids (nasonex)have no immediate benefit in terms of improving symptoms.  Prednisone 20 mg daily  until better then 10 mg per day x one week  and then 5 mg daily x one week then 5 mg every other day seeking the lowest dose that works Nasonex two puffs twice daily taper to one at bedtime if better into each nostril  04/01/2012 f/u ov/[name]  Chief Complaint  Patient presents with  . Followup Sarcoid    Pt overall doing well, but she does c/o rt eye "feeling funny" worried about swelling in her eye.    cc lowest dose of pred 10/5 but misunderstood nasonex instructions - note cough and sob if less than present rx. Already saw Digby in 11/2011 with neg eval but didn't contact him with new ocular symptoms. No blurring or photophia or floaters or discharge    No sob and No obvious daytime variabilty or assoc chronic cough or cp or chest tightness, subjective wheeze overt   hb symptoms. No unusual exp hx    Sleeping ok without nocturnal  or early am exacerbation  of respiratory  c/o's or need for noct saba. Also denies any obvious fluctuation  of symptoms with weather or environmental changes or other aggravating or alleviating factors except as outlined above   ROS  The following are not active complaints unless bolded sore throat, dysphagia, dental problems, itching, sneezing,  nasal congestion or excess/ purulent secretions, ear ache,   fever, chills, sweats, unintended wt loss, pleuritic or exertional cp, hemoptysis,  orthopnea pnd or leg swelling, presyncope, palpitations, heartburn, abdominal pain, anorexia, nausea, vomiting, diarrhea  or change in bowel or urinary habits, change in stools or urine, dysuria,hematuria,  rash, arthralgias, visual complaints, headache, numbness weakness or ataxia or problems with walking or coordination,  change in mood/affect or memory.                    Objective:    Physical Exam  amb bf nad   Wt 10/07/2011  126 vs 133  01/02/2012  Vs 130  04/01/2012        HEENT: nl dentition, and orophanx. Nl external ear canals without cough reflex Moderate bilateral non specific turbinate edema with no secretions, no polyps, no cyanosis - R eye exam completely nl  NECK :  without JVD/ TM/ nl carotid upstrokes bilaterally/ no palp nodes   LUNGS: no acc muscle use, clear to A and P bilaterally without cough on insp or exp maneuvers   CV:  RRR  no s3 or murmur or increase in P2, no edema   ABD:  soft and nontender with nl excursion in the supine position. No bruits or organomegaly, bowel sounds nl  MS:  warm without deformities, calf tenderness, cyanosis or clubbing  SKIN: warm and dry without lesions        CXR  01/02/2012 :  Mediastinal, hilar and pulmonary parenchymal changes of sarcoid, unchanged.           Assessment & Plan:

## 2012-04-01 NOTE — Patient Instructions (Addendum)
I emphasized that nasal steroids (nasonex)have no immediate benefit in terms of improving symptoms.  To help them reached the target tissue, the patient should use nostrilla  two puffs every 12 hours applied one min before using the nasal steroids.  Afrin should be stopped after no more than 5 days.  If the symptoms worsen, Afrin can be restarted after 5 days off of therapy to prevent rebound congestion from overuse of Afrin.  I also emphasized that in no way are nasal steroids a concern in terms of "addiction".    Prednisone 20 mg daily  until better then 10 mg per day x one week  and then 5 mg daily x one week then 5 mg every other day seeking the lowest dose that works  Nasonex two puffs twice daily taper to one at bedtime if better into each nostril  Call Dr Digby's office asap for ocular evaluation  Please schedule a follow up visit in 3 months but call sooner if needed with chest xray on return

## 2012-04-02 NOTE — Assessment & Plan Note (Addendum)
I emphasized that nasal steroids have no immediate benefit in terms of improving symptoms.  To help them reached the target tissue, the patient should use Afrin two puffs every 12 hours applied one min before using the nasal steroids.  Afrin should be stopped after no more than 5 days.  If the symptoms worsen, Afrin can be restarted after 5 days off of therapy to prevent rebound congestion from overuse of Afrin.  I also emphasized that in no way are nasal steroids a concern in terms of "addiction".  

## 2012-04-02 NOTE — Assessment & Plan Note (Addendum)
-   Clinical dx only with symptoms starting ? Nov 2012    - Chronic prednisone rx 08/22/2011 >>>    - Refer to opthamology 11/18/2011 >>> neg eval > referred back 04/01/12 for R eye swelling  The goal with a chronic steroid dependent illness is always arriving at the lowest effective dose that controls the disease/symptoms and not accepting a set "formula" which is based on statistics or guidelines that don't always take into account patient  variability or the natural hx of the dz in every individual patient, which may well vary over time.  For now therefore I recommend the patient maintain  Present floor of 10 a/w 5 mg daily

## 2012-05-20 ENCOUNTER — Telehealth: Payer: Self-pay | Admitting: Internal Medicine

## 2012-05-20 MED ORDER — PREDNISONE 5 MG PO TABS: ORAL_TABLET | ORAL | Status: DC

## 2012-05-20 NOTE — Telephone Encounter (Signed)
Rx for prednisone was refilled Pt aware and states nothing further needed She is aware to keep upcoming appt next month

## 2012-07-05 ENCOUNTER — Ambulatory Visit (INDEPENDENT_AMBULATORY_CARE_PROVIDER_SITE_OTHER): Payer: Self-pay | Admitting: Internal Medicine

## 2012-07-05 ENCOUNTER — Encounter: Payer: Self-pay | Admitting: *Deleted

## 2012-07-05 ENCOUNTER — Encounter: Payer: Self-pay | Admitting: Internal Medicine

## 2012-07-05 ENCOUNTER — Ambulatory Visit (INDEPENDENT_AMBULATORY_CARE_PROVIDER_SITE_OTHER)
Admission: RE | Admit: 2012-07-05 | Discharge: 2012-07-05 | Disposition: A | Discharge status: Home / Self Care | Payer: Self-pay | Source: Ambulatory Visit | Attending: Internal Medicine | Admitting: Internal Medicine

## 2012-07-05 VITALS — BP 96/70 | HR 72 | Temp 99.1°F | Ht 62.5 in | Wt 133.4 lb

## 2012-07-05 DIAGNOSIS — D869 Sarcoidosis, unspecified: Secondary | ICD-10-CM

## 2012-07-05 DIAGNOSIS — J310 Chronic rhinitis: Secondary | ICD-10-CM

## 2012-07-05 MED ORDER — PREDNISONE 5 MG PO TABS: ORAL_TABLET | ORAL | Status: DC

## 2012-07-05 NOTE — Patient Instructions (Addendum)
Prednisone  5mg  one half every other day x 4 weeks, then one half every 3 days for a month and then stop - if condition worsens ok to resume previous dose  Please schedule a follow up visit in 2 months but call sooner if needed

## 2012-07-05 NOTE — Progress Notes (Signed)
Subjective:    Patient ID: Katelyn Stevens, female    DOB: [birth year]   MRN: [id number]  HPI  65 yobf quit smoking 2012 then starting November 2012 onset of nasal congestion then cough and sob in winter of 2013 and then lymph node swelling spring 2013 and referred 08/21/2011 to pulmonary clinic by her brother.  08/21/2011 1st pulmonary eval cc can't breath through nose and green nasal drainage, much better p prednisone x 2 weeks, much worse off it.  Min arthralgias, min sob better on prednisone, no occular complaints or rash. rec augmentin x 10 days one twice daily (yogurt for lunch) Nasal saline irrigation and stop sinex unless you have to one side to sleep x 5 days only Prednisone 10 mg x 2 each until better then one each am Please schedule a follow up office visit in 6 weeks, call sooner if needed with cxr. I do recommend you see an MD eye doctor for a sarcoid eye exam > did not go   10/07/2011 f/u ov/Katelyn Stevens cc trouble sleeping and cramps in fingers, still on prednisone 20 mg per day afraid to decrease as doing so well. No cp, sob, cough, rash, and lymph nodes appear to be shrinking in neck Prednisone is 5 mg daily x 2 weeks then if doing well try 5 mg every other day and if at any point your symptoms recur, resume the previous dose Sarcoidosis pathophysiology and rx reviewed with pt     11/18/2011 f/u ov/Katelyn Stevens  reduced to 5 mg one daily and ok for a week, then nasal congestion flared then increased to 7.5  Then 10 mg per day x 3 days and no better with afrin. rec I emphasized that nasal steroids (nasonex)have no immediate benefit    Augmentin x 10 days one twice daily (yogurt for lunch) Prednisone 20 mg daily  until better then 10 mg per day x 5 days and then 5 mg daily Nasonex two puffs twice daily taper to one at bedtime if better into each nostril I do recommend you see an MD eye doctor for a sarcoid eye exam- > neg  01/02/2012 f/u ov/Katelyn Stevens cc much better  nasal symptoms  still on 10 mg  of pred per day. No occular articular symptoms or rash.  rec I emphasized that nasal steroids (nasonex)have no immediate benefit in terms of improving symptoms.  Prednisone 20 mg daily  until better then 10 mg per day x one week  and then 5 mg daily x one week then 5 mg every other day seeking the lowest dose that works Nasonex two puffs twice daily taper to one at bedtime if better into each nostril  04/01/2012 f/u ov/Katelyn Stevens  Chief Complaint  Patient presents with  . Followup Sarcoid    Pt overall doing well, but she does c/o rt eye "feeling funny" worried about swelling in her eye.    cc lowest dose of pred 10/5 but misunderstood nasonex instructions - note cough and sob if less than present rx. Already saw Digby in 11/2011 with neg eval but didn't contact him with new ocular symptoms. No blurring or photophia or floaters or discharge rec I emphasized that nasal steroids (nasonex)have no immediate benefit in terms of improving symptoms.   .  Prednisone 20 mg daily  until better then 10 mg per day x one week  and then 5 mg daily x one week then 5 mg every other day seeking the lowest dose that works Nasonex two puffs twice  daily taper to one at bedtime if better into each nostril Call Dr Digby's office asap for ocular evaluation   07/05/2012 f/u ov/Katelyn Stevens clinical dx of sarcoid on 5 mg qod  Chief Complaint  Patient presents with  . Follow-up    with cxr.  Breathing doing pretty good.  No complaints at this time.  on days she takes prednisone less aches in hands,  No sinus problems rash or ocular complaints   No sob and No obvious daytime variabilty or assoc chronic cough or cp or chest tightness, subjective wheeze overt   hb symptoms. No unusual exp hx    Sleeping ok without nocturnal  or early am exacerbation  of respiratory  c/o's or need for noct saba. Also denies any obvious fluctuation of symptoms with weather or environmental changes or other aggravating or alleviating factors except  as outlined above   Current Medications, Allergies, Past Medical History, Past Surgical History, Family History, and Social History were reviewed in [employer] record.  ROS  The following are not active complaints unless bolded sore throat, dysphagia, dental problems, itching, sneezing,  nasal congestion or excess/ purulent secretions, ear ache,   fever, chills, sweats, unintended wt loss, pleuritic or exertional cp, hemoptysis,  orthopnea pnd or leg swelling, presyncope, palpitations, heartburn, abdominal pain, anorexia, nausea, vomiting, diarrhea  or change in bowel or urinary habits, change in stools or urine, dysuria,hematuria,  rash, arthralgias, visual complaints, headache, numbness weakness or ataxia or problems with walking or coordination,  change in mood/affect or memory.                        Objective:   Physical Exam  amb bf nad with nl vital signs  Wt 10/07/2011  126 vs 133  01/02/2012  Vs 130  04/01/2012  Vs 133 07/05/2012    HEENT: nl dentition, and orophanx. Nl external ear canals without cough reflex Moderate bilateral non specific turbinate edema with no secretions, no polyps, no cyanosis - R eye exam completely nl  NECK :  without JVD/ TM/ nl carotid upstrokes bilaterally/ no palp nodes   LUNGS: no acc muscle use, clear to A and P bilaterally without cough on insp or exp maneuvers   CV:  RRR  no s3 or murmur or increase in P2, no edema   ABD:  soft and nontender with nl excursion in the supine position. No bruits or organomegaly, bowel sounds nl  MS:  warm without deformities, calf tenderness, cyanosis or clubbing  SKIN: warm and dry without lesions        CXR  07/05/2012 : Stable changes of sarcoid.        Assessment & Plan:

## 2012-07-07 NOTE — Assessment & Plan Note (Signed)
-   Clinical dx only with symptoms starting ? Nov 2012    - Chronic prednisone rx 08/22/2011 >     - Refer to opthamology 11/18/2011 >>> neg eval > referred back 04/01/12 for R eye swelling  The goal with a chronic steroid dependent illness is always arriving at the lowest effective dose that controls the disease/symptoms and not accepting a set "formula" which is based on statistics or guidelines that don't always take into account patient  variability or the natural hx of the dz in every individual patient, which may well vary over time.  For now therefore I recommend the patient taper completely off  See the written copy of this report in the patient's paper medical record.  These results did not interface directly into the electronic medical record and are summarized here.

## 2012-07-07 NOTE — Assessment & Plan Note (Signed)
Adequate control on present rx, reviewed  

## 2012-09-03 ENCOUNTER — Ambulatory Visit (INDEPENDENT_AMBULATORY_CARE_PROVIDER_SITE_OTHER): Payer: Self-pay | Admitting: Internal Medicine

## 2012-09-03 ENCOUNTER — Encounter: Payer: Self-pay | Admitting: Internal Medicine

## 2012-09-03 ENCOUNTER — Encounter: Payer: Self-pay | Admitting: *Deleted

## 2012-09-03 VITALS — BP 102/60 | HR 60 | Temp 98.8°F | Ht 63.0 in | Wt 133.0 lb

## 2012-09-03 DIAGNOSIS — J310 Chronic rhinitis: Secondary | ICD-10-CM

## 2012-09-03 DIAGNOSIS — D869 Sarcoidosis, unspecified: Secondary | ICD-10-CM

## 2012-09-03 MED ORDER — PREDNISONE 5 MG PO TABS: ORAL_TABLET | ORAL | Status: DC

## 2012-09-03 NOTE — Patient Instructions (Addendum)
Prednisone 5 mg one half every 3 days x sev weeks then stop  Try chlortrimeton 4 mg at bedtime as needed for nasal congestion   Please schedule a follow up visit in 3 months but call sooner if needed

## 2012-09-03 NOTE — Progress Notes (Signed)
Subjective:    Patient ID: Katelyn Stevens, female    DOB: 17-Dec-1980   MRN: [id number]    Brief patient profile:  32 yobf quit smoking 2012 then starting November 2012 onset of nasal congestion then cough and sob in winter of 2013 and then lymph node swelling spring 2013 and referred 08/21/2011 to pulmonary clinic by her brother.   HPI  08/21/2011 1st pulmonary eval cc can't breath through nose and green nasal drainage, much better p prednisone x 2 weeks, much worse off it.  Min arthralgias, min sob better on prednisone, no occular complaints or rash. rec augmentin x 10 days one twice daily (yogurt for lunch) Nasal saline irrigation and stop sinex unless you have to one side to sleep x 5 days only Prednisone 10 mg x 2 each until better then one each am Please schedule a follow up office visit in 6 weeks, call sooner if needed with cxr. I do recommend you see an MD eye doctor for a sarcoid eye exam > did not go   10/07/2011 f/u ov/Katelyn Stevens cc trouble sleeping and cramps in fingers, still on prednisone 20 mg per day afraid to decrease as doing so well. No cp, sob, cough, rash, and lymph nodes appear to be shrinking in neck Prednisone is 5 mg daily x 2 weeks then if doing well try 5 mg every other day and if at any point your symptoms recur, resume the previous dose Sarcoidosis pathophysiology and rx reviewed with pt     11/18/2011 f/u ov/Katelyn Stevens  reduced to 5 mg one daily and ok for a week, then nasal congestion flared then increased to 7.5  Then 10 mg per day x 3 days and no better with afrin. rec I emphasized that nasal steroids (nasonex)have no immediate benefit    Augmentin x 10 days one twice daily (yogurt for lunch) Prednisone 20 mg daily  until better then 10 mg per day x 5 days and then 5 mg daily Nasonex two puffs twice daily taper to one at bedtime if better into each nostril I do recommend you see an MD eye doctor for a sarcoid eye exam- > neg  01/02/2012 f/u ov/Katelyn Stevens cc much better   nasal symptoms  still on 10 mg of pred per day. No occular articular symptoms or rash.  rec I emphasized that nasal steroids (nasonex)have no immediate benefit in terms of improving symptoms.  Prednisone 20 mg daily  until better then 10 mg per day x one week  and then 5 mg daily x one week then 5 mg every other day seeking the lowest dose that works Nasonex two puffs twice daily taper to one at bedtime if better into each nostril  04/01/2012 f/u ov/Katelyn Stevens  Chief Complaint  Patient presents with  . Followup Sarcoid    Pt overall doing well, but she does c/o rt eye "feeling funny" worried about swelling in her eye.    cc lowest dose of pred 10/5 but misunderstood nasonex instructions - note cough and sob if less than present rx. Already saw Katelyn Stevens in 11/2011 with neg eval but didn't contact him with new ocular symptoms. No blurring or photophia or floaters or discharge rec I emphasized that nasal steroids (nasonex)have no immediate benefit in terms of improving symptoms.   .  Prednisone 20 mg daily  until better then 10 mg per day x one week  and then 5 mg daily x one week then 5 mg every other day seeking the lowest  dose that works Nasonex two puffs twice daily taper to one at bedtime if better into each nostril Call Dr Katelyn Stevens's office asap for ocular evaluation   07/05/2012 f/u ov/Katelyn Stevens clinical dx of sarcoid on 5 mg qod  Chief Complaint  Patient presents with  . Follow-up    with cxr.  Breathing doing pretty good.  No complaints at this time.  on days she takes prednisone less aches in hands,  No sinus problems rash or ocular cos rec Prednisone  5mg  one half every other day x 4 weeks, then one half every 3 days for a month and then stop - if condition worsens ok to resume previous dose  8/292014 f/u ov/Katelyn Stevens  Still on 5 mg q 3d Chief Complaint  Patient presents with  . Follow-up    Pt states that her breathing is doing well and denies any co's today.   no complaints on days misses  prednisone, specifically no sob, cough, aches, ocular ccs - continues to have some nasal congestion and watery drainage at hs despite nasal steroids.    No sob and No obvious daytime variabilty or assoc   cp or chest tightness, subjective wheeze overt   hb symptoms. No unusual exp hx    Sleeping ok without nocturnal  or early am exacerbation  of respiratory  c/o's or need for noct saba. Also denies any obvious fluctuation of symptoms with weather or environmental changes or other aggravating or alleviating factors except as outlined above   Current Medications, Allergies, Past Medical History, Past Surgical History, Family History, and Social History were reviewed in [employer] record.  ROS  The following are not active complaints unless bolded sore throat, dysphagia, dental problems, itching, sneezing,  nasal congestion or excess/ purulent secretions, ear ache,   fever, chills, sweats, unintended wt loss, pleuritic or exertional cp, hemoptysis,  orthopnea pnd or leg swelling, presyncope, palpitations, heartburn, abdominal pain, anorexia, nausea, vomiting, diarrhea  or change in bowel or urinary habits, change in stools or urine, dysuria,hematuria,  rash, arthralgias, visual complaints, headache, numbness weakness or ataxia or problems with walking or coordination,  change in mood/affect or memory.                        Objective:   Physical Exam  amb bf nad with nl vital signs  Wt 10/07/2011  126 vs 133  01/02/2012  Vs 130  04/01/2012  Vs 133 07/05/2012 vs  133 09/03/12    HEENT: nl dentition, and orophanx. Nl external ear canals without cough reflex Moderate bilateral non specific turbinate edema with no secretions, no polyps, no cyanosis - R eye exam completely nl  NECK :  without JVD/ TM/ nl carotid upstrokes bilaterally/ no palp nodes   LUNGS: no acc muscle use, clear to A and P bilaterally without cough on insp or exp maneuvers   CV:  RRR  no s3 or  murmur or increase in P2, no edema   ABD:  soft and nontender with nl excursion in the supine position. No bruits or organomegaly, bowel sounds nl  MS:  warm without deformities, calf tenderness, cyanosis or clubbing  SKIN: warm and dry without lesions        CXR  07/05/2012 : Stable changes of sarcoid.        Assessment & Plan:

## 2012-09-05 NOTE — Assessment & Plan Note (Signed)
-   Clinical dx only with symptoms starting ? Nov 2012    - Chronic prednisone rx 08/22/2011  The goal with a chronic steroid dependent illness is always arriving at the lowest effective dose that controls the disease/symptoms and not accepting a set "formula" which is based on statistics or guidelines that don't always take into account patient  variability or the natural hx of the dz in every individual patient, which may well vary over time.  For now therefore I recommend the patient maintain  A floor of zero after tapering off another few weeks and looking for any symptoms suggsting sarcoid or adrenal insuff

## 2012-09-05 NOTE — Assessment & Plan Note (Signed)
-   augmentin x 10 days rx 08/22/2011  And 11/18/2011    - Add nasal steroids 11/18/2011  - trial of 1st gen H1 blockers 09/04/11

## 2012-11-29 ENCOUNTER — Ambulatory Visit (INDEPENDENT_AMBULATORY_CARE_PROVIDER_SITE_OTHER): Payer: BC Managed Care – PPO | Admitting: Family Medicine

## 2012-11-29 ENCOUNTER — Encounter: Payer: Self-pay | Admitting: Family Medicine

## 2012-11-29 VITALS — BP 84/68 | HR 88 | Temp 99.2°F | Resp 16 | Ht 63.0 in | Wt 135.0 lb

## 2012-11-29 DIAGNOSIS — N390 Urinary tract infection, site not specified: Secondary | ICD-10-CM

## 2012-11-29 DIAGNOSIS — R300 Dysuria: Secondary | ICD-10-CM

## 2012-11-29 LAB — POCT URINALYSIS DIPSTICK
Glucose, UA: NEGATIVE
Nitrite, UA: NEGATIVE
Urobilinogen, UA: 0.2

## 2012-11-29 LAB — POCT UA - MICROSCOPIC ONLY
Casts, Ur, LPF, POC: NEGATIVE
Yeast, UA: NEGATIVE

## 2012-11-29 LAB — POCT URINE PREGNANCY: Preg Test, Ur: NEGATIVE

## 2012-11-29 MED ORDER — SULFAMETHOXAZOLE-TMP DS 800-160 MG PO TABS: 1.0000 | ORAL_TABLET | Freq: Two times a day (BID) | ORAL | Status: DC

## 2012-11-29 NOTE — Patient Instructions (Signed)
Urinary Tract Infection  Urinary tract infections (UTIs) can develop anywhere along your urinary tract. Your urinary tract is your body's drainage system for removing wastes and extra water. Your urinary tract includes two kidneys, two ureters, a bladder, and a urethra. Your kidneys are a pair of bean-shaped organs. Each kidney is about the size of your fist. They are located below your ribs, one on each side of your spine.  CAUSES  Infections are caused by microbes, which are microscopic organisms, including fungi, viruses, and bacteria. These organisms are so small that they can only be seen through a microscope. Bacteria are the microbes that most commonly cause UTIs.  SYMPTOMS   Symptoms of UTIs may vary by age and gender of the patient and by the location of the infection. Symptoms in young women typically include a frequent and intense urge to urinate and a painful, burning feeling in the bladder or urethra during urination. Older women and men are more likely to be tired, shaky, and weak and have muscle aches and abdominal pain. A fever may mean the infection is in your kidneys. Other symptoms of a kidney infection include pain in your back or sides below the ribs, nausea, and vomiting.  DIAGNOSIS  To diagnose a UTI, your caregiver will ask you about your symptoms. Your caregiver also will ask to provide a urine sample. The urine sample will be tested for bacteria and white blood cells. White blood cells are made by your body to help fight infection.  TREATMENT   Typically, UTIs can be treated with medication. Because most UTIs are caused by a bacterial infection, they usually can be treated with the use of antibiotics. The choice of antibiotic and length of treatment depend on your symptoms and the type of bacteria causing your infection.  HOME CARE INSTRUCTIONS   If you were prescribed antibiotics, take them exactly as your caregiver instructs you. Finish the medication even if you feel better after you  have only taken some of the medication.   Drink enough water and fluids to keep your urine clear or pale yellow.   Avoid caffeine, tea, and carbonated beverages. They tend to irritate your bladder.   Empty your bladder often. Avoid holding urine for long periods of time.   Empty your bladder before and after sexual intercourse.   After a bowel movement, women should cleanse from front to back. Use each tissue only once.  SEEK MEDICAL CARE IF:    You have back pain.   You develop a fever.   Your symptoms do not begin to resolve within 3 days.  SEEK IMMEDIATE MEDICAL CARE IF:    You have severe back pain or lower abdominal pain.   You develop chills.   You have nausea or vomiting.   You have continued burning or discomfort with urination.  MAKE SURE YOU:    Understand these instructions.   Will watch your condition.   Will get help right away if you are not doing well or get worse.  Document Released: 10/02/2004 Document Revised: 06/24/2011 Document Reviewed: 01/31/2011  [employer] Patient Information 2014 [employer]

## 2012-11-29 NOTE — Progress Notes (Signed)
Subjective: 32 year old lady who's been having some mild dysuria for over a week. She is now developed a little bit of flank pain. She's not been running fever.  Objective: No major CVA tenderness. Mild suprapubic tenderness.  Results for orders placed in visit on 11/29/12  POCT UA - MICROSCOPIC ONLY      Result Value Range   WBC, Ur, HPF, POC 8-12     RBC, urine, microscopic 1-3     Bacteria, U Microscopic small     Mucus, UA neg     Epithelial cells, urine per micros 1-3     Crystals, Ur, HPF, POC neg     Casts, Ur, LPF, POC neg     Yeast, UA neg    POCT URINALYSIS DIPSTICK      Result Value Range   Color, UA yellow     Clarity, UA clear     Glucose, UA neg     Bilirubin, UA neg     Ketones, UA neg     Spec Grav, UA <=1.005     Blood, UA trace     pH, UA 5.5     Protein, UA neg     Urobilinogen, UA 0.2     Nitrite, UA neg     Leukocytes, UA moderate (2+)    POCT URINE PREGNANCY      Result Value Range   Preg Test, Ur Negative     Assessment: UTI  Plan: Antibiotics Urine

## 2012-11-30 ENCOUNTER — Telehealth: Payer: Self-pay | Admitting: Physician Assistant

## 2012-11-30 NOTE — Telephone Encounter (Signed)
Called to report that the medication prescribed at last night's visit was not at pharmacy.  She's been there twice, which is terribly inconvenient, as she doesn't live or work near there (she selected that pharmacy because last night she needed a 24-hour location. She reports that she called here earlier today and asked about it, but it's still not at the [facility] on [address]. She is very upset and frustrated.  I apologized for her inconvenience and frustration over this issue and agreed to take care of it now, while she waits.  Chart reviewed.  No documentation of her earlier call. She doesn't recall who she spoke with.  Medication was sent to the [facility] on [address].  Advised the patient, and contacted the  [facility] pharmacy to verify they received the prescription.  They told me it was filled and waiting for her.

## 2012-12-02 LAB — URINE CULTURE

## 2012-12-04 MED ORDER — CIPROFLOXACIN HCL 500 MG PO TABS: 500.0000 mg | ORAL_TABLET | Freq: Two times a day (BID) | ORAL | Status: DC

## 2012-12-04 NOTE — Addendum Note (Signed)
Addended by: Johnnette Litter on: 12/04/2012 10:51 AM   Modules accepted: Orders

## 2013-01-10 ENCOUNTER — Encounter: Payer: Self-pay | Admitting: *Deleted

## 2013-01-10 ENCOUNTER — Encounter: Payer: Self-pay | Admitting: Internal Medicine

## 2013-01-10 ENCOUNTER — Ambulatory Visit (INDEPENDENT_AMBULATORY_CARE_PROVIDER_SITE_OTHER): Payer: BC Managed Care – PPO | Admitting: Internal Medicine

## 2013-01-10 VITALS — BP 94/60 | HR 60 | Temp 99.0°F | Ht 62.5 in | Wt 139.0 lb

## 2013-01-10 DIAGNOSIS — Z0000 Encounter for general adult medical examination without abnormal findings: Secondary | ICD-10-CM

## 2013-01-10 DIAGNOSIS — D869 Sarcoidosis, unspecified: Secondary | ICD-10-CM

## 2013-01-10 MED ORDER — PREDNISONE 5 MG PO TABS: ORAL_TABLET | ORAL | Status: DC

## 2013-01-10 NOTE — Patient Instructions (Addendum)
Prednisone 2.5 mg every other day x 2 weeks and stop and see if any symptoms flare (cough, short of breath, nausea, aches) - call if occurs   Please see patient coordinator before you leave today  to schedule GYN evaluation/ establish regular follow up  Please schedule a follow up office visit in 6 weeks, call sooner if needed with pfts and cxr

## 2013-01-10 NOTE — Progress Notes (Signed)
Subjective:    Patient ID: Katelyn Stevens, female    DOB: [birth year]   MRN: [id number]    Brief patient profile:  32 yobf quit smoking 2012 then starting November 2012 onset of nasal congestion then cough and sob in winter of 2013 and then lymph node swelling spring 2013 and referred 08/21/2011 to pulmonary clinic by her brother.   HPI  08/21/2011 1st pulmonary eval cc can't breath through nose and green nasal drainage, much better p prednisone x 2 weeks, much worse off it.  Min arthralgias, min sob better on prednisone, no occular complaints or rash. rec augmentin x 10 days one twice daily (yogurt for lunch) Nasal saline irrigation and stop sinex unless you have to one side to sleep x 5 days only Prednisone 10 mg x 2 each until better then one each am Please schedule a follow up office visit in 6 weeks, call sooner if needed with cxr. I do recommend you see an MD eye doctor for a sarcoid eye exam > did not go   10/07/2011 f/u ov/Katelyn Stevens cc trouble sleeping and cramps in fingers, still on prednisone 20 mg per day afraid to decrease as doing so well. No cp, sob, cough, rash, and lymph nodes appear to be shrinking in neck Prednisone is 5 mg daily x 2 weeks then if doing well try 5 mg every other day and if at any point your symptoms recur, resume the previous dose Sarcoidosis pathophysiology and rx reviewed with pt     11/18/2011 f/u ov/Katelyn Stevens  reduced to 5 mg one daily and ok for a week, then nasal congestion flared then increased to 7.5  Then 10 mg per day x 3 days and no better with afrin. rec I emphasized that nasal steroids (nasonex)have no immediate benefit    Augmentin x 10 days one twice daily (yogurt for lunch) Prednisone 20 mg daily  until better then 10 mg per day x 5 days and then 5 mg daily Nasonex two puffs twice daily taper to one at bedtime if better into each nostril I do recommend you see an MD eye doctor for a sarcoid eye exam- > neg  01/02/2012 f/u ov/Katelyn Stevens cc much better   nasal symptoms  still on 10 mg of pred per day. No occular articular symptoms or rash.  rec I emphasized that nasal steroids (nasonex)have no immediate benefit in terms of improving symptoms.  Prednisone 20 mg daily  until better then 10 mg per day x one week  and then 5 mg daily x one week then 5 mg every other day seeking the lowest dose that works Nasonex two puffs twice daily taper to one at bedtime if better into each nostril  04/01/2012 f/u ov/Katelyn Stevens  Chief Complaint  Patient presents with  . Followup Sarcoid    Pt overall doing well, but she does c/o rt eye "feeling funny" worried about swelling in her eye.    cc lowest dose of pred 10/5 but misunderstood nasonex instructions - note cough and sob if less than present rx. Already saw Digby in 11/2011 with neg eval but didn't contact him with new ocular symptoms. No blurring or photophia or floaters or discharge rec I emphasized that nasal steroids (nasonex)have no immediate benefit in terms of improving symptoms.   .  Prednisone 20 mg daily  until better then 10 mg per day x one week  and then 5 mg daily x one week then 5 mg every other day seeking the lowest  dose that works Nasonex two puffs twice daily taper to one at bedtime if better into each nostril Call Dr Digby's office asap for ocular evaluation   07/05/2012 f/u ov/Katelyn Stevens clinical dx of sarcoid on 5 mg qod  Chief Complaint  Patient presents with  . Follow-up    with cxr.  Breathing doing pretty good.  No complaints at this time.  on days she takes prednisone less aches in hands,  No sinus problems rash or ocular cos rec Prednisone  5mg  one half every other day x 4 weeks, then one half every 3 days for a month and then stop - if condition worsens ok to resume previous dose  8/292014 f/u ov/Katelyn Stevens  Still on 5 mg q 3d Chief Complaint  Patient presents with  . Follow-up    Pt states that her breathing is doing well and denies any co's today.   no complaints on days misses  prednisone, specifically no sob, cough, aches, ocular ccs - continues to have some nasal congestion and watery drainage at hs despite nasal steroids.  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 Follow-up    Pt states doing well and denies any co's today.    no rash, sob, cough   No sob and No obvious daytime variabilty or assoc   cp or chest tightness, subjective wheeze overt   hb symptoms. No unusual exp hx    Sleeping ok without nocturnal  or early am exacerbation  of respiratory  c/o's or need for noct saba. Also denies any obvious fluctuation of symptoms with weather or environmental changes or other aggravating or alleviating factors except as outlined above   Current Medications, Allergies, Past Medical History, Past Surgical History, Family History, and Social History were reviewed in [employer] record.  ROS  The following are not active complaints unless bolded sore throat, dysphagia, dental problems, itching, sneezing,  nasal congestion or excess/ purulent secretions, ear ache,   fever, chills, sweats, unintended wt loss, pleuritic or exertional cp, hemoptysis,  orthopnea pnd or leg swelling, presyncope, palpitations, heartburn, abdominal pain, anorexia, nausea, vomiting, diarrhea  or change in bowel or urinary habits, change in stools or urine, dysuria,hematuria,  rash, arthralgias, visual complaints, headache, numbness weakness or ataxia or problems with walking or coordination,  change in mood/affect or memory.                        Objective:   Physical Exam  amb bf nad with nl vital signs  Wt 10/07/2011  126 vs 133  01/02/2012  Vs 130  04/01/2012  Vs 133 07/05/2012 vs  133 09/03/12 > 01/10/2013  139    HEENT: nl dentition, and orophanx. Nl external ear canals without  cough reflex Mild bilateral non specific turbinate edema with no secretions, no polyps, no cyanosis - R eye exam completely nl  NECK :  without JVD/ TM/ nl carotid upstrokes bilaterally/ no palp nodes   LUNGS: no acc muscle use, clear to A and P bilaterally without cough on insp or exp maneuvers   CV:  RRR  no s3 or murmur or increase in P2, no edema   ABD:  soft and nontender with nl excursion in the supine position. No bruits or  organomegaly, bowel sounds nl  MS:  warm without deformities, calf tenderness, cyanosis or clubbing  SKIN: warm and dry without lesions        CXR  07/05/2012 : Stable changes of sarcoid.        Assessment & Plan:

## 2013-01-11 NOTE — Assessment & Plan Note (Addendum)
-   Clinical dx only with symptoms starting ? Nov 2012    - Chronic prednisone rx 08/22/2011 >  01/11/2013     - Refer to opthamology 11/18/2011 >>> neg eval > referred back 04/01/12 for R eye swelling   The goal with a chronic steroid dependent illness is always arriving at the lowest effective dose that controls the disease/symptoms and not accepting a set "formula" which is based on statistics or guidelines that don't always take into account patient  variability or the natural hx of the dz in every individual patient, which may well vary over time.  For now therefore I recommend the patient maintain  A floor of Zero and return here for restaging with cxr and pfts in 6 weeks

## 2013-01-30 ENCOUNTER — Inpatient Hospital Stay (HOSPITAL_COMMUNITY)
Admission: AD | Admit: 2013-01-30 | Discharge: 2013-01-31 | Disposition: A | Discharge status: Home / Self Care | Payer: BC Managed Care – PPO | Source: Ambulatory Visit | Attending: Obstetrics and Gynecology | Admitting: Obstetrics and Gynecology

## 2013-01-30 ENCOUNTER — Emergency Department (HOSPITAL_COMMUNITY)
Admission: EM | Admit: 2013-01-30 | Discharge: 2013-01-30 | Discharge status: Against Medical Advice | Payer: BC Managed Care – PPO | Attending: Emergency Medicine | Admitting: Emergency Medicine

## 2013-01-30 ENCOUNTER — Inpatient Hospital Stay (HOSPITAL_COMMUNITY): Payer: BC Managed Care – PPO

## 2013-01-30 ENCOUNTER — Encounter (HOSPITAL_COMMUNITY): Payer: Self-pay

## 2013-01-30 DIAGNOSIS — IMO0002 Reserved for concepts with insufficient information to code with codable children: Secondary | ICD-10-CM

## 2013-01-30 DIAGNOSIS — O99330 Smoking (tobacco) complicating pregnancy, unspecified trimester: Secondary | ICD-10-CM | POA: Insufficient documentation

## 2013-01-30 DIAGNOSIS — N949 Unspecified condition associated with female genital organs and menstrual cycle: Secondary | ICD-10-CM | POA: Insufficient documentation

## 2013-01-30 DIAGNOSIS — O021 Missed abortion: Secondary | ICD-10-CM | POA: Insufficient documentation

## 2013-01-30 DIAGNOSIS — Z0389 Encounter for observation for other suspected diseases and conditions ruled out: Secondary | ICD-10-CM | POA: Insufficient documentation

## 2013-01-30 DIAGNOSIS — R109 Unspecified abdominal pain: Secondary | ICD-10-CM | POA: Insufficient documentation

## 2013-01-30 DIAGNOSIS — Q5128 Other doubling of uterus, other specified: Secondary | ICD-10-CM | POA: Insufficient documentation

## 2013-01-30 DIAGNOSIS — F17200 Nicotine dependence, unspecified, uncomplicated: Secondary | ICD-10-CM | POA: Insufficient documentation

## 2013-01-30 DIAGNOSIS — Q512 Other doubling of uterus, unspecified: Secondary | ICD-10-CM

## 2013-01-30 LAB — URINALYSIS, ROUTINE W REFLEX MICROSCOPIC
Bilirubin Urine: NEGATIVE
Glucose, UA: NEGATIVE mg/dL
Hgb urine dipstick: NEGATIVE
Ketones, ur: NEGATIVE mg/dL
LEUKOCYTES UA: NEGATIVE
NITRITE: NEGATIVE
Protein, ur: NEGATIVE mg/dL
SPECIFIC GRAVITY, URINE: 1.02 (ref 1.005–1.030)
UROBILINOGEN UA: 0.2 mg/dL (ref 0.0–1.0)
pH: 6 (ref 5.0–8.0)

## 2013-01-30 LAB — CBC
HCT: 33.2 % — ABNORMAL LOW (ref 36.0–46.0)
HEMOGLOBIN: 11.9 g/dL — AB (ref 12.0–15.0)
MCH: 27.6 pg (ref 26.0–34.0)
MCHC: 35.8 g/dL (ref 30.0–36.0)
MCV: 77 fL — AB (ref 78.0–100.0)
Platelets: 235 10*3/uL (ref 150–400)
RBC: 4.31 MIL/uL (ref 3.87–5.11)
RDW: 13 % (ref 11.5–15.5)
WBC: 7.5 10*3/uL (ref 4.0–10.5)

## 2013-01-30 LAB — ABO/RH: ABO/RH(D): O POS

## 2013-01-30 LAB — WET PREP, GENITAL
CLUE CELLS WET PREP: NONE SEEN
Trich, Wet Prep: NONE SEEN
WBC, Wet Prep HPF POC: NONE SEEN
YEAST WET PREP: NONE SEEN

## 2013-01-30 LAB — HCG, QUANTITATIVE, PREGNANCY: hCG, Beta Chain, Quant, S: 9403 m[IU]/mL — ABNORMAL HIGH (ref <5)

## 2013-01-30 LAB — POCT PREGNANCY, URINE: Preg Test, Ur: POSITIVE — AB

## 2013-01-30 MED ORDER — ACETAMINOPHEN-CODEINE 300-30 MG PO TABS: 1.0000 | ORAL_TABLET | ORAL | Status: DC | PRN

## 2013-01-30 MED ORDER — MISOPROSTOL 200 MCG PO TABS: ORAL_TABLET | ORAL | Status: DC

## 2013-01-30 MED ORDER — PROMETHAZINE HCL 12.5 MG PO TABS: 12.5000 mg | ORAL_TABLET | Freq: Four times a day (QID) | ORAL | Status: DC | PRN

## 2013-01-30 NOTE — MAU Provider Note (Signed)
History     CSN: [id number]  Arrival date and time: 01/30/13 2050   First Provider Initiated Contact with Patient 01/30/13 2124      Chief Complaint  Patient presents with  . Abdominal Cramping  . Vaginal Bleeding   HPI  Pt is a 33 yo G4P1 at unknown gestation Patient's last menstrual period was 11/21/2012. which was uncertain.  Here today with report of vaginal bleeding that started two days ago.  Bleeding was dark brown in nature.  Today bleeding was more red and mucusy in nature.  Also reports intermittent lower pelvic pain.    Past Medical History  Diagnosis Date  . Sarcoidosis     Past Surgical History  Procedure Laterality Date  . Uterine didelphis      Family History  Problem Relation Age of Onset  . Breast cancer Mother   . Liver cancer Maternal Grandmother     History  Substance Use Topics  . Smoking status: Current Every Day Smoker -- 0.50 packs/day for 6 years    Types: Cigarettes    Last Attempt to Quit: 08/07/2010  . Smokeless tobacco: Never Used  . Alcohol Use: 1.8 oz/week    3 Shots of liquor per week     Comment: 2 glasses a week     Allergies:  Allergies  Allergen Reactions  . Aspirin Anaphylaxis    Facial swelling    Prescriptions prior to admission  Medication Sig Dispense Refill  . predniSONE (DELTASONE) 5 MG tablet One half every other day x 2 weeks and then try stopping  30 tablet  0    Review of Systems  Gastrointestinal: Positive for abdominal pain (cramping).  Genitourinary:       Vaginal bleeding  All other systems reviewed and are negative.   Physical Exam   Blood pressure 100/65, pulse 74, temperature 98.1 F (36.7 C), temperature source Oral, resp. rate 18, last menstrual period 11/21/2012.  Physical Exam  Constitutional: She is oriented to person, place, and time. She appears well-developed and well-nourished. No distress.  HENT:  Head: Normocephalic.  Neck: Normal range of motion. Neck supple.  Cardiovascular:  Normal rate, regular rhythm and normal heart sounds.   Respiratory: Effort normal and breath sounds normal. No respiratory distress.  GI: Soft. There is no tenderness.  Genitourinary: Uterus is enlarged. Cervix exhibits no motion tenderness. Right adnexum displays no mass and no tenderness. Left adnexum displays no mass and no tenderness. There is bleeding (scant) around the vagina.  Musculoskeletal: Normal range of motion. She exhibits no edema.  Neurological: She is alert and oriented to person, place, and time.  Skin: Skin is warm and dry.    MAU Course  Procedures  Results for orders placed during the hospital encounter of 01/30/13 (from the past 48 hour(s))  URINALYSIS, ROUTINE W REFLEX MICROSCOPIC     Status: None   Collection Time    01/30/13  9:05 PM      Result Value Range   Color, Urine YELLOW  YELLOW   APPearance CLEAR  CLEAR   Specific Gravity, Urine 1.020  1.005 - 1.030   pH 6.0  5.0 - 8.0   Glucose, UA NEGATIVE  NEGATIVE mg/dL   Hgb urine dipstick NEGATIVE  NEGATIVE   Bilirubin Urine NEGATIVE  NEGATIVE   Ketones, ur NEGATIVE  NEGATIVE mg/dL   Protein, ur NEGATIVE  NEGATIVE mg/dL   Urobilinogen, UA 0.2  0.0 - 1.0 mg/dL   Nitrite NEGATIVE  NEGATIVE   Leukocytes,  UA NEGATIVE  NEGATIVE   Comment: MICROSCOPIC NOT DONE ON URINES WITH NEGATIVE PROTEIN, BLOOD, LEUKOCYTES, NITRITE, OR GLUCOSE <1000 mg/dL.  WET PREP, GENITAL     Status: None   Collection Time    01/30/13  9:35 PM      Result Value Range   Yeast Wet Prep HPF POC NONE SEEN  NONE SEEN   Trich, Wet Prep NONE SEEN  NONE SEEN   Clue Cells Wet Prep HPF POC NONE SEEN  NONE SEEN   WBC, Wet Prep HPF POC NONE SEEN  NONE SEEN   Comment: FEW  ABO/RH     Status: None   Collection Time    01/30/13  9:40 PM      Result Value Range   ABO/RH(D) O POS    CBC     Status: Abnormal   Collection Time    01/30/13  9:40 PM      Result Value Range   WBC 7.5  4.0 - 10.5 K/uL   RBC 4.31  3.87 - 5.11 MIL/uL   Hemoglobin 11.9  (*) 12.0 - 15.0 g/dL   HCT 16.1 (*) 09.6 - 04.5 %   MCV 77.0 (*) 78.0 - 100.0 fL   MCH 27.6  26.0 - 34.0 pg   MCHC 35.8  30.0 - 36.0 g/dL   RDW 40.9  81.1 - 91.4 %   Platelets 235  150 - 400 K/uL  HCG, QUANTITATIVE, PREGNANCY     Status: Abnormal   Collection Time    01/30/13  9:40 PM      Result Value Range   hCG, Beta Chain, Quant, S 9403 (*) <5 mIU/mL   Comment:              GEST. AGE      CONC.  (mIU/mL)       <=1 WEEK        5 - 50         2 WEEKS       50 - 500         3 WEEKS       100 - 10,000         4 WEEKS     1,000 - 30,000         5 WEEKS     3,500 - 115,000       6-8 WEEKS     12,000 - 270,000        12 WEEKS     15,000 - 220,000                FEMALE AND NON-PREGNANT FEMALE:         LESS THAN 5 mIU/mL  POCT PREGNANCY, URINE     Status: Abnormal   Collection Time    01/30/13  9:40 PM      Result Value Range   Preg Test, Ur POSITIVE (*) NEGATIVE   Comment:            THE SENSITIVITY OF THIS     METHODOLOGY IS >24 mIU/mL   Ultrasound: FINDINGS:  Intrauterine gestational sac: Identified, irregular in appearance,  located within the left side of a didelphys uterus  Yolk sac: Not seen  Embryo: Identified  Cardiac Activity: Not identified  Heart Rate: Absent  CRL: 18 mm 8 w 2d [place] EDC: 09/10/2013  Maternal uterus/adnexae: No subchronic hemorrhage. Normal  sonographic appearance to the ovaries. No free fluid.  IMPRESSION:  Single intrauterine gestation however no cardiac  activity  identified. This is concerning for a nonviable pregnancy.  Consulted with Dr. Emelda FearFerguson > reviewed HPI/med hx/ob history/exam/labs > may treat SAB with cytotec   Early Intrauterine Pregnancy Failure Protocol X  Documented intrauterine pregnancy failure less than or equal to [redacted] weeks   gestation  X  No serious current illness  X  Baseline Hgb greater than or equal to 10g/dl  X  Patient has easily accessible transportation to the hospital  X  Clear preference  X   Practitioner/physician deems patient reliable  X  Counseling by practitioner or physician  X  Patient education by RN  X  Cytotec 800 mcg Intravaginally by patient at home       Intravaginally by NP in MAU       Rectally by patient at home       Rectally by RN in MAU  X  Intravaginally by patient at home X   Ibuprofen 600 mg 1 tablet by mouth every 6 hours as needed #30 - prescribed  X   Phenergan 12.5 mg by mouth every 4 hours as needed for nausea - prescribed  Reviewed with pt cytotec procedure.  Pt verbalizes that she lives close to the hospital and has transportation readily available.  Pt appears reliable and verbalizes understanding and agrees with plan of care  Assessment and Plan  33 yo G4P1021 at 3873w2d  Fetal Demise Didelphys Uterus   Plan: Discharge to home RX Cytotec 800 mcg intravaginally RX Tylenol #3 for pain RX Phenergan 12.5 mg for nausea Follow-up in clinic in two weeks   [facility] Center[facility]UHAMMAD,WALIDAH 01/30/2013, 9:26 PM

## 2013-01-30 NOTE — Discharge Instructions (Signed)
Incomplete Miscarriage  A miscarriage is the sudden loss of an unborn baby (fetus) before the 20th week of pregnancy. In an incomplete miscarriage, parts of the fetus or placenta (afterbirth) remain in the body.  Having a miscarriage can be an emotional experience. Talk with your health care provider about any questions you may have about miscarrying, the grieving process, and your future pregnancy plans.  CAUSES  Problems with the fetal chromosomes that make it impossible for the baby to develop normally. Problems with the baby's genes or chromosomes are most often the result of errors that occur by chance as the embryo divides and grows. The problems are not inherited from the parents.  Infection of the cervix or uterus.  Hormone problems.  Problems with the cervix, such as having an incompetent cervix. This is when the tissue in the cervix is not strong enough to hold the pregnancy.  Problems with the uterus, such as an abnormally shaped uterus, uterine fibroids, or congenital abnormalities.  Certain medical conditions.  Smoking, drinking alcohol, or taking illegal drugs.  Trauma. SYMPTOMS  Vaginal bleeding or spotting, with or without cramps or pain.  Pain or cramping in the abdomen or lower back.  Passing fluid, tissue, or blood clots from the vagina. DIAGNOSIS  Your health care provider will perform a physical exam. You may also have an ultrasound to confirm the miscarriage. Blood or urine tests may also be ordered.  TREATMENT  If you have Rh negative blood and your baby was Rh positive, you will need an Rho(D) immune globulin shot. This shot will protect any future baby from having Rh blood problems in future pregnancies.  You may be confined to bed rest. This means you should stay in bed and only get up to use the bathroom. HOME CARE INSTRUCTIONS  Rest as directed by your health care provider.  Restrict activity as directed by your health care provider. You may be allowed to continue  light activity if curettage was not done but you require further treatment.  Keep track of the number of pads you use each day. Keep track of how soaked (saturated) they are. Record this information.  Do not use tampons.  Do not douche or have sexual intercourse until approved by your health care provider.  Keep all follow-up appointments for re-evaluation and continuing management.  Only take over-the-counter or prescription medicines for pain, fever, or discomfort as directed by your health care provider.  Take antibiotic medicine as directed by your health care provider. Make sure you finish it even if you start to feel better. SEEK IMMEDIATE MEDICAL CARE IF:  You experience severe cramps in your stomach, back, or abdomen.  You have an unexplained temperature (make sure to record these temperatures).  You pass large clots or tissue (save these for your health care provider to inspect).  Your bleeding increases.  You become light-headed, weak, or have fainting episodes. MAKE SURE YOU:  Understand these instructions.  Will watch your condition.  Will get help right away if you are not doing well or get worse. Document Released: 12/23/2004 Document Revised: 10/13/2012 Document Reviewed: 07/22/2012  [facility] System[facility] Patient Information 2014 [place], [place].

## 2013-01-30 NOTE — MAU Note (Signed)
Pt presents complaining of spotting when she wipes and abdominal cramping that started 1 hour ago. Denies any other vaginal discharge.

## 2013-01-31 LAB — GC/CHLAMYDIA PROBE AMP
CT Probe RNA: NEGATIVE
GC PROBE AMP APTIMA: NEGATIVE

## 2013-02-01 ENCOUNTER — Encounter: Payer: Self-pay | Admitting: Obstetrics & Gynecology

## 2013-02-02 ENCOUNTER — Inpatient Hospital Stay (HOSPITAL_COMMUNITY)
Admission: AD | Admit: 2013-02-02 | Discharge: 2013-02-02 | Disposition: A | Discharge status: Home / Self Care | Payer: BC Managed Care – PPO | Source: Ambulatory Visit | Attending: Obstetrics and Gynecology | Admitting: Obstetrics and Gynecology

## 2013-02-02 ENCOUNTER — Encounter (HOSPITAL_COMMUNITY): Payer: Self-pay | Admitting: *Deleted

## 2013-02-02 DIAGNOSIS — O039 Complete or unspecified spontaneous abortion without complication: Secondary | ICD-10-CM | POA: Insufficient documentation

## 2013-02-02 DIAGNOSIS — O99330 Smoking (tobacco) complicating pregnancy, unspecified trimester: Secondary | ICD-10-CM | POA: Insufficient documentation

## 2013-02-02 LAB — CBC
HEMATOCRIT: 26.8 % — AB (ref 36.0–46.0)
Hemoglobin: 9.5 g/dL — ABNORMAL LOW (ref 12.0–15.0)
MCH: 27.2 pg (ref 26.0–34.0)
MCHC: 35.4 g/dL (ref 30.0–36.0)
MCV: 76.8 fL — AB (ref 78.0–100.0)
Platelets: 207 10*3/uL (ref 150–400)
RBC: 3.49 MIL/uL — ABNORMAL LOW (ref 3.87–5.11)
RDW: 13 % (ref 11.5–15.5)
WBC: 7.6 10*3/uL (ref 4.0–10.5)

## 2013-02-02 LAB — HCG, QUANTITATIVE, PREGNANCY: hCG, Beta Chain, Quant, S: 3281 m[IU]/mL — ABNORMAL HIGH (ref <5)

## 2013-02-02 MED ORDER — LACTATED RINGERS IV SOLN
Freq: Once | INTRAVENOUS | Status: AC
Start: 2013-02-02 — End: 2013-02-02
  Administered 2013-02-02: 12:00:00 via INTRAVENOUS
  Filled 2013-02-02: qty 1000

## 2013-02-02 MED ORDER — HYDROMORPHONE HCL PF 1 MG/ML IJ SOLN
1.0000 mg | Freq: Once | INTRAMUSCULAR | Status: AC
  Administered 2013-02-02: 1 mg via INTRAMUSCULAR

## 2013-02-02 MED ORDER — LACTATED RINGERS IV SOLN
INTRAVENOUS | Status: DC
  Filled 2013-02-02 (×7): qty 1000

## 2013-02-02 MED ORDER — KETOROLAC TROMETHAMINE 60 MG/2ML IM SOLN
60.0000 mg | Freq: Once | INTRAMUSCULAR | Status: AC
  Administered 2013-02-02: 60 mg via INTRAMUSCULAR
  Filled 2013-02-02: qty 2

## 2013-02-02 MED ORDER — HYDROMORPHONE HCL PF 1 MG/ML IJ SOLN
INTRAMUSCULAR | Status: AC
  Filled 2013-02-02: qty 1

## 2013-02-02 MED ORDER — LACTATED RINGERS IV SOLN
Freq: Once | INTRAVENOUS | Status: AC
  Administered 2013-02-02: 13:00:00 via INTRAVENOUS

## 2013-02-02 NOTE — Discharge Instructions (Signed)
Miscarriage  A miscarriage is the loss of an unborn baby (fetus) before the 20th week of pregnancy. The cause is often unknown.   HOME CARE  · You may need to stay in bed (bed rest), or you may be able to do light activity. Go about activity as told by your doctor.  · Have help at home.  · Write down how many pads you use each day. Write down how soaked they are.  · Do not use tampons. Do not wash out your vagina (douche) or have sex (intercourse) until your doctor approves.  · Only take medicine as told by your doctor.  · Do not take aspirin.  · Keep all doctor visits as told.  · If you or your partner have problems with grieving, talk to your doctor. You can also try counseling. Give yourself time to grieve before trying to get pregnant again.  GET HELP RIGHT AWAY IF:  · You have bad cramps or pain in your back or belly (abdomen).  · You have a fever.  · You pass large clumps of blood (clots) from your vagina that are walnut-sized or larger. Save the clumps for your doctor to see.  · You pass large amounts of tissue from your vagina. Save the tissue for your doctor to see.  · You have more bleeding.  · You have thick, bad-smelling fluid (discharge) coming from the vagina.  · You get lightheaded, weak, or you pass out (faint).  · You have chills.  MAKE SURE YOU:  · Understand these instructions.  · Will watch your condition.  · Will get help right away if you are not doing well or get worse.  Document Released: 03/17/2011 Document Reviewed: 03/17/2011  [employer]® Patient Information ©2014 [employer].

## 2013-02-02 NOTE — MAU Note (Signed)
Gypsy LoreL. Barefoot NP at bedside for evaluation. O2 2L applied per NP for comfort.

## 2013-02-02 NOTE — MAU Provider Note (Signed)
History     CSN: [id number]  Arrival date and time: 02/02/13 1055   First Provider Initiated Contact with Patient 02/02/13 1104      Chief Complaint  Patient presents with  . Vaginal Bleeding   HPI Comments: Katelyn Stevens [age] y.o. [id number] presents to MAU with vaginal bleeding following miscarriage and Cytotec. She is in significant discomfort and feels dizzy.     Past Medical History  Diagnosis Date  . Sarcoidosis     Past Surgical History  Procedure Laterality Date  . Uterine didelphis      Family History  Problem Relation Age of Onset  . Breast cancer Mother   . Liver cancer Maternal Grandmother     History  Substance Use Topics  . Smoking status: Current Every Day Smoker -- 0.50 packs/day for 6 years    Types: Cigarettes    Last Attempt to Quit: 08/07/2010  . Smokeless tobacco: Never Used  . Alcohol Use: 1.8 oz/week    3 Shots of liquor per week     Comment: 2 glasses a week     Allergies:  Allergies  Allergen Reactions  . Aspirin Anaphylaxis    Facial swelling    Prescriptions prior to admission  Medication Sig Dispense Refill  . Acetaminophen-Codeine (TYLENOL/CODEINE #3) 300-30 MG per tablet Take 1 tablet by mouth every 4 (four) hours as needed for pain.  15 tablet  0  . misoprostol (CYTOTEC) 200 MCG tablet Insert in vagina at once; do not take orally.  4 tablet  0  . promethazine (PHENERGAN) 12.5 MG tablet Take 1 tablet (12.5 mg total) by mouth every 6 (six) hours as needed for nausea or vomiting.  10 tablet  0    Review of Systems  Constitutional: Positive for malaise/fatigue.  Cardiovascular: Positive for palpitations.  Gastrointestinal: Negative.   Genitourinary:       Significant blood loss  Neurological: Positive for dizziness and weakness.  Psychiatric/Behavioral: Negative.    Physical Exam   Blood pressure 88/64, pulse 98, temperature 98 F (36.7 C), temperature source Oral, resp. rate 16, height 5\' 2"  (1.575 m), weight 62.596 kg  (138 lb), last menstrual period 11/21/2012, SpO2 100.00%.  Physical Exam  Constitutional: She is oriented to person, place, and time. She appears well-developed and well-nourished. She appears distressed.  Neck: Normal range of motion.  Cardiovascular: Normal rate and regular rhythm.   Respiratory: Effort normal and breath sounds normal.  GI: There is tenderness.  Vaginally: Large amount blood, clots and what appears to be POC.   Musculoskeletal: Normal range of motion.  Neurological: She is alert and oriented to person, place, and time.  Skin: There is pallor.  Psychiatric: She has a normal mood and affect.   Results for orders placed during the hospital encounter of 02/02/13 (from the past 24 hour(s))  CBC     Status: Abnormal   Collection Time    02/02/13 11:30 AM      Result Value Range   WBC 7.6  4.0 - 10.5 K/uL   RBC 3.49 (*) 3.87 - 5.11 MIL/uL   Hemoglobin 9.5 (*) 12.0 - 15.0 g/dL   HCT 13.226.8 (*) 44.036.0 - 10.246.0 %   MCV 76.8 (*) 78.0 - 100.0 fL   MCH 27.2  26.0 - 34.0 pg   MCHC 35.4  30.0 - 36.0 g/dL   RDW 72.513.0  36.611.5 - 44.015.5 %   Platelets 207  150 - 400 K/uL      MAU Course  Procedures  MDM  Dilaudid 1 mg for pain O2 at 2 liters Send POC to Path CBC, Quant IVF LR X 2 Toradol 60 mg/ pt takes Motrin at home without problems Reviewed case with Dr Fredia Sorrow 2:30 pm feeling better, able to get up to bathroom, passed one clot, otherwise no new bleeding  Assessment and Plan   A: Miscarriage  P: Above orders Advised to be out work for few days Advised to start MVI with iron and discussed iron rich foods Follow up with Dr Fredia Sorrow  Carolynn Serve 02/02/2013, 11:35 AM

## 2013-02-02 NOTE — MAU Provider Note (Signed)
Attestation of Attending Supervision of Advanced Practitioner: Evaluation and management procedures were performed by the PA/NP/CNM/OB Fellow under my supervision/collaboration. Chart reviewed and agree with management and plan.  Eryc Bodey V 02/02/2013 5:34 PM    

## 2013-02-02 NOTE — MAU Note (Addendum)
Patient presents to MAU with c/o heavy vaginal bleeding since this am with quarter sized clots. Patient brought straight to room from lobby via wheelchair. Bleeding through clothes noted; pad saturated. Vitals obtained. Reports dizziness, ringing of ears, and  nausea. NP called to bedside.

## 2013-02-14 ENCOUNTER — Encounter: Payer: BC Managed Care – PPO | Admitting: Obstetrics & Gynecology

## 2013-02-22 ENCOUNTER — Other Ambulatory Visit: Payer: Self-pay | Admitting: Obstetrics and Gynecology

## 2013-02-22 ENCOUNTER — Ambulatory Visit: Payer: Self-pay | Admitting: Internal Medicine

## 2013-05-16 ENCOUNTER — Telehealth: Payer: Self-pay | Admitting: Internal Medicine

## 2013-05-16 NOTE — Telephone Encounter (Signed)
lmomtcb x1 

## 2013-05-17 NOTE — Telephone Encounter (Signed)
lmtcb x2 

## 2013-05-18 NOTE — Telephone Encounter (Signed)
I spoke with the pt and she states that she is having increased nasal congestion, some chest congestion, and dry cough. Pt is asking for rx for prednisone. Last OV 01-2013 so I advised she needs OV. She states she will have to check with her boss and call us back. Carron CurieJennifer Castillo, CMA

## 2013-07-07 ENCOUNTER — Ambulatory Visit: Payer: Self-pay | Admitting: Internal Medicine

## 2013-07-18 ENCOUNTER — Encounter (HOSPITAL_COMMUNITY): Payer: Self-pay | Admitting: *Deleted

## 2013-07-18 ENCOUNTER — Inpatient Hospital Stay (HOSPITAL_COMMUNITY)
Admission: AD | Admit: 2013-07-18 | Discharge: 2013-07-18 | Disposition: A | Discharge status: Home / Self Care | Payer: BC Managed Care – PPO | Source: Ambulatory Visit | Attending: Family Medicine | Admitting: Family Medicine

## 2013-07-18 ENCOUNTER — Inpatient Hospital Stay (HOSPITAL_COMMUNITY): Payer: BC Managed Care – PPO

## 2013-07-18 DIAGNOSIS — O99330 Smoking (tobacco) complicating pregnancy, unspecified trimester: Secondary | ICD-10-CM | POA: Insufficient documentation

## 2013-07-18 DIAGNOSIS — Z803 Family history of malignant neoplasm of breast: Secondary | ICD-10-CM | POA: Insufficient documentation

## 2013-07-18 DIAGNOSIS — O200 Threatened abortion: Secondary | ICD-10-CM

## 2013-07-18 DIAGNOSIS — O209 Hemorrhage in early pregnancy, unspecified: Secondary | ICD-10-CM | POA: Insufficient documentation

## 2013-07-18 DIAGNOSIS — D869 Sarcoidosis, unspecified: Secondary | ICD-10-CM | POA: Insufficient documentation

## 2013-07-18 LAB — CBC
HEMATOCRIT: 35.5 % — AB (ref 36.0–46.0)
HEMOGLOBIN: 12.7 g/dL (ref 12.0–15.0)
MCH: 27.7 pg (ref 26.0–34.0)
MCHC: 35.8 g/dL (ref 30.0–36.0)
MCV: 77.3 fL — AB (ref 78.0–100.0)
Platelets: 317 10*3/uL (ref 150–400)
RBC: 4.59 MIL/uL (ref 3.87–5.11)
RDW: 14.3 % (ref 11.5–15.5)
WBC: 9.4 10*3/uL (ref 4.0–10.5)

## 2013-07-18 LAB — WET PREP, GENITAL
CLUE CELLS WET PREP: NONE SEEN
Trich, Wet Prep: NONE SEEN
Yeast Wet Prep HPF POC: NONE SEEN

## 2013-07-18 LAB — POCT PREGNANCY, URINE: Preg Test, Ur: POSITIVE — AB

## 2013-07-18 LAB — HCG, QUANTITATIVE, PREGNANCY: hCG, Beta Chain, Quant, S: 752 m[IU]/mL — ABNORMAL HIGH

## 2013-07-18 NOTE — Discharge Instructions (Signed)
Vaginal Bleeding During Pregnancy, First Trimester  A small amount of bleeding (spotting) from the vagina is relatively common in early pregnancy. It usually stops on its own. Various things may cause bleeding or spotting in early pregnancy. Some bleeding may be related to the pregnancy, and some may not. In most cases, the bleeding is normal and is not a problem. However, bleeding can also be a sign of something serious. Be sure to tell your health care provider about any vaginal bleeding right away.  Some possible causes of vaginal bleeding during the first trimester include:  · Infection or inflammation of the cervix.  · Growths (polyps) on the cervix.  · Miscarriage or threatened miscarriage.  · Pregnancy tissue has developed outside of the uterus and in a fallopian tube (tubal pregnancy).  · Tiny cysts have developed in the uterus instead of pregnancy tissue (molar pregnancy).  HOME CARE INSTRUCTIONS   Watch your condition for any changes. The following actions may help to lessen any discomfort you are feeling:  · Follow your health care provider's instructions for limiting your activity. If your health care provider orders bed rest, you may need to stay in bed and only get up to use the bathroom. However, your health care provider may allow you to continue light activity.  · If needed, make plans for someone to help with your regular activities and responsibilities while you are on bed rest.  · Keep track of the number of pads you use each day, how often you change pads, and how soaked (saturated) they are. Write this down.  · Do not use tampons. Do not douche.  · Do not have sexual intercourse or orgasms until approved by your health care provider.  · If you pass any tissue from your vagina, save the tissue so you can show it to your health care provider.  · Only take over-the-counter or prescription medicines as directed by your health care provider.  · Do not take aspirin because it can make you  bleed.  · Keep all follow-up appointments as directed by your health care provider.  SEEK MEDICAL CARE IF:  · You have any vaginal bleeding during any part of your pregnancy.  · You have cramps or labor pains.  · You have a fever, not controlled by medicine.  SEEK IMMEDIATE MEDICAL CARE IF:   · You have severe cramps in your back or belly (abdomen).  · You pass large clots or tissue from your vagina.  · Your bleeding increases.  · You feel light-headed or weak, or you have fainting episodes.  · You have chills.  · You are leaking fluid or have a gush of fluid from your vagina.  · You pass out while having a bowel movement.  MAKE SURE YOU:  · Understand these instructions.  · Will watch your condition.  · Will get help right away if you are not doing well or get worse.  Document Released: 10/02/2004 Document Revised: 12/28/2012 Document Reviewed: 08/30/2012  ExitCare® Patient Information ©2015 [employer]. This information is not intended to replace advice given to you by your health care provider. Make sure you discuss any questions you have with your health care provider.

## 2013-07-18 NOTE — MAU Note (Signed)
Started having pinkish d/c on Sat, heavier more of a flow yesterday, now brownish spotting.

## 2013-07-18 NOTE — MAU Provider Note (Signed)
Attestation of Attending Supervision of Advanced Practitioner (PA/CNM/NP): Evaluation and management procedures were performed by the Advanced Practitioner under my supervision and collaboration.  I have reviewed the Advanced Practitioner's note and chart, and I agree with the management and plan.  Reva BoresPRATT,TANYA S, MD [facility] Attending 07/18/2013 7:36 PM

## 2013-07-18 NOTE — MAU Provider Note (Signed)
First Provider Initiated Contact with Patient 07/18/13 1738      Chief Complaint:  Vaginal Bleeding and Possible Pregnancy   Katelyn Stevens is  33 y.o. [id number] at 4+5 by US today presents complaining of Vaginal Bleeding and Possible Pregnancy Katelyn Stevens is a 33 y.o. G5P1031 at ~6wk by LMP and hx of sarcoidosis presents after having vaginal bleeding that started Saturday and tapered off Sunday now to only having a little vaginal discharge, no Cramping, no fevers or chills, no abdominal pain, no HA, no CP, no SOB, no n/v. No other complaints.  Obstetrical/Gynecological History: OB History   Grav Para Term Preterm Abortions TAB SAB Ect Mult Living   6 1 1  3 1 2   1      Past Medical History: Past Medical History  Diagnosis Date  . Sarcoidosis     Past Surgical History: Past Surgical History  Procedure Laterality Date  . Uterine didelphis      Family History: Family History  Problem Relation Age of Onset  . Breast cancer Mother   . Liver cancer Maternal Grandmother     Social History: History  Substance Use Topics  . Smoking status: Current Every Day Smoker -- 0.50 packs/day for 6 years    Types: Cigarettes    Last Attempt to Quit: 08/07/2010  . Smokeless tobacco: Never Used  . Alcohol Use: 1.8 oz/week    3 Shots of liquor per week     Comment: 2 glasses a week     Allergies:  Allergies  Allergen Reactions  . Aspirin Other (See Comments)    Facial swelling    Meds:  No prescriptions prior to admission    Physical Exam  Blood pressure 100/74, pulse 83, temperature 98.9 F (37.2 C), temperature source Oral, resp. rate 18, height 5' 1.25" (1.556 m), weight 64.411 kg (142 lb), last menstrual period 06/07/2013, unknown if currently breastfeeding. GENERAL: Well-developed, well-nourished female in no acute distress.  ABDOMEN: Soft, nontender, nondistended, gravid.  SSE: brown discharge mild from cervix SVE: firm cervix fingertip, unable to reach internal  Os EXTREMITIES: Nontender, no edema, 2+ distal pulses. Dilation: Fingertip Effacement (%): Thick Cervical Position: Middle Exam by:: Dr. Ike Benedom   Labs: Results for orders placed during the hospital encounter of 07/18/13 (from the past 24 hour(s))  HCG, QUANTITATIVE, PREGNANCY   Collection Time    07/18/13  5:30 PM      Result Value Ref Range   hCG, Beta Chain, Quant, S 752 (*) <5 mIU/mL  CBC   Collection Time    07/18/13  5:30 PM      Result Value Ref Range   WBC 9.4  4.0 - 10.5 K/uL   RBC 4.59  3.87 - 5.11 MIL/uL   Hemoglobin 12.7  12.0 - 15.0 g/dL   HCT 62.135.5 (*) 30.836.0 - 65.746.0 %   MCV 77.3 (*) 78.0 - 100.0 fL   MCH 27.7  26.0 - 34.0 pg   MCHC 35.8  30.0 - 36.0 g/dL   RDW 84.614.3  96.211.5 - 95.215.5 %   Platelets 317  150 - 400 K/uL  POCT PREGNANCY, URINE   Collection Time    07/18/13  5:30 PM      Result Value Ref Range   Preg Test, Ur POSITIVE (*) NEGATIVE  WET PREP, GENITAL   Collection Time    07/18/13  5:40 PM      Result Value Ref Range   Yeast Wet Prep HPF POC NONE SEEN  NONE SEEN   Trich, Wet Prep NONE SEEN  NONE SEEN   Clue Cells Wet Prep HPF POC NONE SEEN  NONE SEEN   WBC, Wet Prep HPF POC FEW (*) NONE SEEN   Imaging Studies:  No results found.  Assessment: Katelyn Stevens is  33 y.o. [phone] presents with vaginal bleeding in early pregnancy. Pt BHCG is lower than expected at 4w based on [place] GSD, however bleeding has stopped. Will have pt return in 48hr for repeat HCG to determine viability of pregnancy. Pt given septic AB precautions and will reeval viability at next visit  Tawana Scale 7/13/20157:29 PM  Discussed with Dr. Shawnie Pons

## 2013-07-19 LAB — GC/CHLAMYDIA PROBE AMP
CT PROBE, AMP APTIMA: NEGATIVE
GC Probe RNA: NEGATIVE

## 2013-07-20 ENCOUNTER — Inpatient Hospital Stay (HOSPITAL_COMMUNITY)
Admission: AD | Admit: 2013-07-20 | Discharge: 2013-07-20 | Disposition: A | Discharge status: Home / Self Care | Payer: BC Managed Care – PPO | Source: Ambulatory Visit | Attending: Obstetrics & Gynecology | Admitting: Obstetrics & Gynecology

## 2013-07-20 DIAGNOSIS — O021 Missed abortion: Secondary | ICD-10-CM

## 2013-07-20 LAB — HCG, QUANTITATIVE, PREGNANCY: hCG, Beta Chain, Quant, S: 529 m[IU]/mL — ABNORMAL HIGH (ref <5)

## 2013-07-20 MED ORDER — OXYCODONE-ACETAMINOPHEN 5-325 MG PO TABS: 1.0000 | ORAL_TABLET | ORAL | Status: DC | PRN

## 2013-07-20 MED ORDER — PROMETHAZINE HCL 12.5 MG PO TABS: 12.5000 mg | ORAL_TABLET | Freq: Four times a day (QID) | ORAL | Status: DC | PRN

## 2013-07-20 NOTE — MAU Provider Note (Signed)
First Provider Initiated Contact with Patient 07/20/13 2048     Ms. Katelyn PiggRobin I Meegan is a 33 y.o. [phone] at 8012w1d who presents to MAU today for 48 hour follow-up hCG. The patient continues to have bleeding, states that since last night bleeding is now red. She is wearing pads, but not changing often. She states occasional sharp pains that come and go. She denies fever, N/V, weakness, dizziness or fatigue today  BP 100/72  Pulse 73  Temp(Src) 99 F (37.2 C) (Oral)  Resp 18  SpO2 100%  LMP 06/07/2013 GENERAL: Well-developed, well-nourished female in no acute distress.  HEENT: Normocephalic, atraumatic.   LUNGS: Effort normal HEART: Regular rate  SKIN: Warm, dry and without erythema PSYCH: Normal mood and affect   Results for Katelyn PiggJONES, Arieona I (MRN [id number]) as of 07/20/2013 20:48  Ref. Range 07/18/2013 17:30 07/18/2013 17:40 07/18/2013 19:17 07/20/2013 20:16  hCG, Beta Chain, Quant, S Latest Range: <5 mIU/mL 752 (H)   529 (H)    MDM Discussed at length Cytotec vs. Expectant management. Patient states that she used Cytotec in January during last SAB and had very heavy bleeding. She does not wish to do that again at this time.   A: Missed AB  P: Discharge home Rx for Percocet and Phenergan given Recommended Ibuprofen PRN for breakthrough pain Bleeding precautions discussed [facility]-3 weeks. They will call the patient with an appointment Patient may return to MAU as needed or if her condition were to change or worsen  Freddi StarrJulie N Ethier, PA-C  07/20/2013 8:50 PM

## 2013-07-20 NOTE — MAU Note (Signed)
Pt reports bright red bleeding on tissue when she wipes, lower abd pain off/on. Here for follow up BHCG

## 2013-07-20 NOTE — Discharge Instructions (Signed)
Incomplete Miscarriage °A miscarriage is the sudden loss of an unborn baby (fetus) before the 20th week of pregnancy. In an incomplete miscarriage, parts of the fetus or placenta (afterbirth) remain in the body.  °Having a miscarriage can be an emotional experience. Talk with your health care provider about any questions you may have about miscarrying, the grieving process, and your future pregnancy plans. °CAUSES  °· Problems with the fetal chromosomes that make it impossible for the baby to develop normally. Problems with the baby's genes or chromosomes are most often the result of errors that occur by chance as the embryo divides and grows. The problems are not inherited from the parents. °· Infection of the cervix or uterus. °· Hormone problems. °· Problems with the cervix, such as having an incompetent cervix. This is when the tissue in the cervix is not strong enough to hold the pregnancy. °· Problems with the uterus, such as an abnormally shaped uterus, uterine fibroids, or congenital abnormalities. °· Certain medical conditions. °· Smoking, drinking alcohol, or taking illegal drugs. °· Trauma. °SYMPTOMS  °· Vaginal bleeding or spotting, with or without cramps or pain. °· Pain or cramping in the abdomen or lower back. °· Passing fluid, tissue, or blood clots from the vagina. °DIAGNOSIS  °Your health care provider will perform a physical exam. You may also have an ultrasound to confirm the miscarriage. Blood or urine tests may also be ordered. °TREATMENT  °· Usually, a dilation and curettage (D&C) procedure is performed. During a D&C procedure, the cervix is widened (dilated) and any remaining fetal or placental tissue is gently removed from the uterus. °· Antibiotic medicines are prescribed if there is an infection. Other medicines may be given to reduce the size of the uterus (contract) if there is a lot of bleeding. °· If you have Rh negative blood and your baby was Rh positive, you will need an Rho(D)  immune globulin shot. This shot will protect any future baby from having Rh blood problems in future pregnancies. °· You may be confined to bed rest. This means you should stay in bed and only get up to use the bathroom. °HOME CARE INSTRUCTIONS  °· Rest as directed by your health care provider. °· Restrict activity as directed by your health care provider. You may be allowed to continue light activity if curettage was not done but you require further treatment. °· Keep track of the number of pads you use each day. Keep track of how soaked (saturated) they are. Record this information. °· Do not  use tampons. °· Do not douche or have sexual intercourse until approved by your health care provider. °· Keep all follow-up appointments for re-evaluation and continuing management. °· Only take over-the-counter or prescription medicines for pain, fever, or discomfort as directed by your health care provider. °· Take antibiotic medicine as directed by your health care provider. Make sure you finish it even if you start to feel better. °SEEK IMMEDIATE MEDICAL CARE IF:  °· You experience severe cramps in your stomach, back, or abdomen. °· You have an unexplained temperature (make sure to record these temperatures). °· You pass large clots or tissue (save these for your health care provider to inspect). °· Your bleeding increases. °· You become light-headed, weak, or have fainting episodes. °MAKE SURE YOU:  °· Understand these instructions. °· Will watch your condition. °· Will get help right away if you are not doing well or get worse. °Document Released: 12/23/2004 Document Revised: 10/13/2012 Document Reviewed: 07/22/2012 °  ExitCare® Patient Information ©2015 [employer]. This information is not intended to replace advice given to you by your health care provider. Make sure you discuss any questions you have with your health care provider. ° °

## 2013-07-28 ENCOUNTER — Other Ambulatory Visit: Payer: Self-pay | Admitting: Internal Medicine

## 2013-07-28 DIAGNOSIS — D869 Sarcoidosis, unspecified: Secondary | ICD-10-CM

## 2013-07-29 ENCOUNTER — Ambulatory Visit (INDEPENDENT_AMBULATORY_CARE_PROVIDER_SITE_OTHER): Payer: BC Managed Care – PPO | Admitting: Internal Medicine

## 2013-07-29 DIAGNOSIS — D869 Sarcoidosis, unspecified: Secondary | ICD-10-CM

## 2013-07-29 NOTE — Progress Notes (Signed)
PFT done today. 

## 2013-07-31 LAB — PULMONARY FUNCTION TEST
DL/VA % pred: 105 %
DL/VA: 4.78 ml/min/mmHg/L
DLCO UNC % PRED: 82 %
DLCO unc: 17.75 ml/min/mmHg
FEF 25-75 PRE: 2.41 L/s
FEF 25-75 Post: 2.65 L/sec
FEF2575-%Change-Post: 10 %
FEF2575-%Pred-Post: 92 %
FEF2575-%Pred-Pre: 83 %
FEV1-%CHANGE-POST: 2 %
FEV1-%Pred-Post: 90 %
FEV1-%Pred-Pre: 88 %
FEV1-PRE: 2.19 L
FEV1-Post: 2.24 L
FEV1FVC-%Change-Post: 2 %
FEV1FVC-%PRED-PRE: 98 %
FEV6-%CHANGE-POST: 0 %
FEV6-%PRED-POST: 91 %
FEV6-%Pred-Pre: 91 %
FEV6-POST: 2.62 L
FEV6-Pre: 2.62 L
FEV6FVC-%CHANGE-POST: 0 %
FEV6FVC-%PRED-PRE: 102 %
FEV6FVC-%Pred-Post: 102 %
FVC-%Change-Post: 0 %
FVC-%PRED-POST: 89 %
FVC-%PRED-PRE: 89 %
FVC-POST: 2.62 L
FVC-Pre: 2.62 L
Post FEV1/FVC ratio: 85 %
Post FEV6/FVC ratio: 100 %
Pre FEV1/FVC ratio: 84 %
Pre FEV6/FVC Ratio: 100 %
RV % pred: 60 %
RV: 0.83 L
TLC % pred: 75 %
TLC: 3.57 L

## 2013-08-01 ENCOUNTER — Telehealth: Payer: Self-pay | Admitting: Internal Medicine

## 2013-08-01 ENCOUNTER — Encounter: Payer: Self-pay | Admitting: Internal Medicine

## 2013-08-01 NOTE — Progress Notes (Signed)
Quick Note:  Called and spoke to pt. Informed pt of results and recs per MW. Pt verbalized understanding and denied any further questions or concerns at this time. ______

## 2013-08-01 NOTE — Progress Notes (Signed)
Quick Note:  LMTCB ______ 

## 2013-08-01 NOTE — Telephone Encounter (Signed)
Notes Recorded by Christen ButterLeslie M Raskin, CMA on 08/01/2013 at 9:15 AM LMTCB  Notes Recorded by Nyoka CowdenMichael B Wert, MD on 08/01/2013 at 8:53 AM Call patient : Study is unremarkable, no change in recs    Called and spoke to pt. Informed pt of results and recs per MW. Pt verbalized understanding and denied any further questions or concerns at this time.

## 2013-08-09 ENCOUNTER — Other Ambulatory Visit: Payer: BC Managed Care – PPO

## 2013-08-09 ENCOUNTER — Ambulatory Visit (INDEPENDENT_AMBULATORY_CARE_PROVIDER_SITE_OTHER)
Admission: RE | Admit: 2013-08-09 | Discharge: 2013-08-09 | Disposition: A | Discharge status: Home / Self Care | Payer: BC Managed Care – PPO | Source: Ambulatory Visit | Attending: Internal Medicine | Admitting: Internal Medicine

## 2013-08-09 ENCOUNTER — Ambulatory Visit (INDEPENDENT_AMBULATORY_CARE_PROVIDER_SITE_OTHER): Payer: BC Managed Care – PPO | Admitting: Internal Medicine

## 2013-08-09 ENCOUNTER — Encounter: Payer: Self-pay | Admitting: Internal Medicine

## 2013-08-09 VITALS — BP 90/60 | HR 67 | Temp 99.0°F | Ht 62.5 in | Wt 141.0 lb

## 2013-08-09 DIAGNOSIS — D869 Sarcoidosis, unspecified: Secondary | ICD-10-CM

## 2013-08-09 NOTE — Patient Instructions (Addendum)
Please remember to go to the lab and x-ray department downstairs for your tests - we will call you with the results when they are available.  If all the studies are negative for active sarcoid we can just see you back as needed for respiratory symptoms as needed

## 2013-08-09 NOTE — Progress Notes (Signed)
Subjective:    Patient ID: Katelyn Stevens, female    DOB: 1980-10-23   MRN: [id number]    Brief patient profile:  33 yobf quit smoking 2012 then starting November 2012 onset of nasal congestion then cough and sob in winter of 2013 and then lymph node swelling spring 2013 and referred 08/21/2011 to pulmonary clinic by her brother.   HPI  08/21/2011 1st pulmonary eval cc can't breath through nose and green nasal drainage, much better p prednisone x 2 weeks, much worse off it.  Min arthralgias, min sob better on prednisone, no occular complaints or rash. rec augmentin x 10 days one twice daily (yogurt for lunch) Nasal saline irrigation and stop sinex unless you have to one side to sleep x 5 days only Prednisone 10 mg x 2 each until better then one each am Please schedule a follow up office visit in 6 weeks, call sooner if needed with cxr. I do recommend you see an MD eye doctor for a sarcoid eye exam > did not go   10/07/2011 f/u ov/Wert cc trouble sleeping and cramps in fingers, still on prednisone 20 mg per day afraid to decrease as doing so well. No cp, sob, cough, rash, and lymph nodes appear to be shrinking in neck Prednisone is 5 mg daily x 2 weeks then if doing well try 5 mg every other day and if at any point your symptoms recur, resume the previous dose Sarcoidosis pathophysiology and rx reviewed with pt     11/18/2011 f/u ov/Wert  reduced to 5 mg one daily and ok for a week, then nasal congestion flared then increased to 7.5  Then 10 mg per day x 3 days and no better with afrin. rec I emphasized that nasal steroids (nasonex)have no immediate benefit    Augmentin x 10 days one twice daily (yogurt for lunch) Prednisone 20 mg daily  until better then 10 mg per day x 5 days and then 5 mg daily Nasonex two puffs twice daily taper to one at bedtime if better into each nostril I do recommend you see an MD eye doctor for a sarcoid eye exam- > neg  01/02/2012 f/u ov/Wert cc much better   nasal symptoms  still on 10 mg of pred per day. No occular articular symptoms or rash.  rec I emphasized that nasal steroids (nasonex)have no immediate benefit in terms of improving symptoms.  Prednisone 20 mg daily  until better then 10 mg per day x one week  and then 5 mg daily x one week then 5 mg every other day seeking the lowest dose that works Nasonex two puffs twice daily taper to one at bedtime if better into each nostril  04/01/2012 f/u ov/Wert  Chief Complaint  Patient presents with  . Followup Sarcoid    Pt overall doing well, but she does c/o rt eye "feeling funny" worried about swelling in her eye.    cc lowest dose of pred 10/5 but misunderstood nasonex instructions - note cough and sob if less than present rx. Already saw Digby in 11/2011 with neg eval but didn't contact him with new ocular symptoms. No blurring or photophia or floaters or discharge rec I emphasized that nasal steroids (nasonex)have no immediate benefit in terms of improving symptoms.   .  Prednisone 20 mg daily  until better then 10 mg per day x one week  and then 5 mg daily x one week then 5 mg every other day seeking the lowest  dose that works Nasonex two puffs twice daily taper to one at bedtime if better into each nostril Call Dr Digby's office asap for ocular evaluation   07/05/2012 f/u ov/Wert clinical dx of sarcoid on 5 mg qod  Chief Complaint  Patient presents with  . Follow-up    with cxr.  Breathing doing pretty good.  No complaints at this time.  on days she takes prednisone less aches in hands,  No sinus problems rash or ocular cos rec Prednisone  5mg  one half every other day x 4 weeks, then one half every 3 days for a month and then stop - if condition worsens ok to resume previous dose  8/292014 f/u ov/Wert  Still on 5 mg q 3d Chief Complaint  Patient presents with  . Follow-up    Pt states that her breathing is doing well and denies any co's today.   no complaints on days misses  prednisone, specifically no sob, cough, aches, ocular ccs - continues to have some nasal congestion and watery drainage at hs despite nasal steroids.  rec Prednisone 5 mg one half every 3 days x sev weeks then stop Try chlortrimeton 4 mg at bedtime as needed for nasal congestion     01/10/2013 f/u ov/Wert re: ? Steroid dep sarcoid Chief Complaint  Patient presents with  . Follow-up    Pt states doing well and denies any co's today.    no rash, sob, cough  rec Prednisone 2.5 mg every other day x 2 weeks and stop and see if any symptoms flare (cough, short of breath, nausea, aches) - call if occurs  Please see patient coordinator before you leave today  to schedule GYN evaluation/ establish regular follow up Please schedule a follow up office visit in 6 weeks, call sooner if needed with pfts and cxr    08/09/2013 f/u ov/Wert re: f/u sarcoid  Chief Complaint  Patient presents with  . Follow-up    Pt states that overall she is doing well. She states that her lymph nodes under her chin feel like they may be swollen.      No ocular, artricular complaints or rash, sob.  No obvious day to day or daytime variabilty or assoc chronic cough or cp or chest tightness, subjective wheeze overt sinus or hb symptoms. No unusual exp hx or h/o childhood pna/ asthma or knowledge of premature birth.  Sleeping ok without nocturnal  or early am exacerbation  of respiratory  c/o's or need for noct saba. Also denies any obvious fluctuation of symptoms with weather or environmental changes or other aggravating or alleviating factors except as outlined above   Current Medications, Allergies, Complete Past Medical History, Past Surgical History, Family History, and Social History were reviewed in [employer] record.  ROS  The following are not active complaints unless bolded sore throat, dysphagia, dental problems, itching, sneezing,  nasal congestion or excess/ purulent secretions, ear ache,    fever, chills, sweats, unintended wt loss, pleuritic or exertional cp, hemoptysis,  orthopnea pnd or leg swelling, presyncope, palpitations, heartburn, abdominal pain, anorexia, nausea, vomiting, diarrhea  or change in bowel or urinary habits, change in stools or urine, dysuria,hematuria,  rash, arthralgias, visual complaints, headache, numbness weakness or ataxia or problems with walking or coordination,  change in mood/affect or memory.                         Objective:   Physical Exam  amb  bf nad with nl vital signs  Wt 10/07/2011  126 vs 133  01/02/2012  Vs 130  04/01/2012  Vs 133 07/05/2012 vs  133 09/03/12 > 01/10/2013  139  > 08/09/2013  141    HEENT: nl dentition, and orophanx. Nl external ear canals without cough reflex Nl turbinates now, no polyps, no cyanosis -    NECK :  without JVD/ TM/ nl carotid upstrokes bilaterally/ no palp nodes   LUNGS: no acc muscle use, clear to A and P bilaterally without cough on insp or exp maneuvers   CV:  RRR  no s3 or murmur or increase in P2, no edema   ABD:  soft and nontender with nl excursion in the supine position. No bruits or organomegaly, bowel sounds nl  MS:  warm without deformities, calf tenderness, cyanosis or clubbing  SKIN: warm and dry without lesions        CXR  08/09/2013 : No def sarcoid changes      Chemistry      Component Value Date/Time   NA 133* 08/10/2013 0920   K 3.9 08/10/2013 0920   CL 101 08/10/2013 0920   CO2 25 08/10/2013 0920   BUN 11 08/10/2013 0920   CREATININE 0.8 08/10/2013 0920      Component Value Date/Time   CALCIUM 9.0 08/10/2013 0920   ALKPHOS 51 08/10/2013 0920   AST 22 08/10/2013 0920   ALT 11 08/10/2013 0920   BILITOT 0.4 08/10/2013 0920      Lab Results  Component Value Date   WBC 5.4 08/10/2013   HGB 12.1 08/10/2013   HCT 36.4 08/10/2013   MCV 81.5 08/10/2013   PLT 267.0 08/10/2013            Assessment & Plan:

## 2013-08-10 ENCOUNTER — Ambulatory Visit: Payer: BC Managed Care – PPO

## 2013-08-10 DIAGNOSIS — D869 Sarcoidosis, unspecified: Secondary | ICD-10-CM

## 2013-08-10 LAB — HEPATIC FUNCTION PANEL
ALBUMIN: 3.8 g/dL (ref 3.5–5.2)
ALK PHOS: 51 U/L (ref 39–117)
ALT: 11 U/L (ref 0–35)
AST: 22 U/L (ref 0–37)
Bilirubin, Direct: 0 mg/dL (ref 0.0–0.3)
TOTAL PROTEIN: 6.8 g/dL (ref 6.0–8.3)
Total Bilirubin: 0.4 mg/dL (ref 0.2–1.2)

## 2013-08-10 LAB — CBC WITH DIFFERENTIAL/PLATELET
BASOS ABS: 0 10*3/uL (ref 0.0–0.1)
BASOS PCT: 0.8 % (ref 0.0–3.0)
EOS ABS: 0.1 10*3/uL (ref 0.0–0.7)
Eosinophils Relative: 1.7 % (ref 0.0–5.0)
HCT: 36.4 % (ref 36.0–46.0)
HEMOGLOBIN: 12.1 g/dL (ref 12.0–15.0)
LYMPHS ABS: 1.5 10*3/uL (ref 0.7–4.0)
LYMPHS PCT: 27 % (ref 12.0–46.0)
MCHC: 33.3 g/dL (ref 30.0–36.0)
MCV: 81.5 fl (ref 78.0–100.0)
Monocytes Absolute: 0.6 10*3/uL (ref 0.1–1.0)
Monocytes Relative: 11.3 % (ref 3.0–12.0)
NEUTROS ABS: 3.2 10*3/uL (ref 1.4–7.7)
Neutrophils Relative %: 59.2 % (ref 43.0–77.0)
Platelets: 267 10*3/uL (ref 150.0–400.0)
RBC: 4.47 Mil/uL (ref 3.87–5.11)
RDW: 13.5 % (ref 11.5–15.5)
WBC: 5.4 10*3/uL (ref 4.0–10.5)

## 2013-08-10 LAB — BASIC METABOLIC PANEL
BUN: 11 mg/dL (ref 6–23)
CALCIUM: 9 mg/dL (ref 8.4–10.5)
CHLORIDE: 101 meq/L (ref 96–112)
CO2: 25 meq/L (ref 19–32)
Creatinine, Ser: 0.8 mg/dL (ref 0.4–1.2)
GFR: 104.96 mL/min (ref >60.00)
Glucose, Bld: 78 mg/dL (ref 70–99)
Potassium: 3.9 mEq/L (ref 3.5–5.1)
SODIUM: 133 meq/L — AB (ref 135–145)

## 2013-08-10 NOTE — Assessment & Plan Note (Signed)
-   Clinical dx only with symptoms starting ? Nov 2012    - Chronic prednisone rx 08/22/2011 >  01/11/2013     - Refer to opthamology 11/18/2011 >>> neg eval > referred back 04/01/12 for R eye swelling    - PFT's 07/29/13 >  wnl    - CXR 08/09/13  wnl   No evidence at all of sign dz activity now 3 years since onset of pred x 6 m so unlikely to recur so f/u prn  See instructions for specific recommendations which were reviewed directly with the patient who was given a copy with highlighter outlining the key components.

## 2013-08-10 NOTE — Progress Notes (Signed)
Quick Note:  LMTCB ______ 

## 2013-08-11 ENCOUNTER — Telehealth: Payer: Self-pay | Admitting: General Practice

## 2013-08-11 ENCOUNTER — Telehealth: Payer: Self-pay | Admitting: Internal Medicine

## 2013-08-11 ENCOUNTER — Encounter: Payer: BC Managed Care – PPO | Admitting: Obstetrics & Gynecology

## 2013-08-11 NOTE — Progress Notes (Signed)
Quick Note:  See 8.6.15 PN ______

## 2013-08-11 NOTE — Telephone Encounter (Signed)
Spoke with pt and notified of results per Dr. Wert. Pt verbalized understanding and denied any questions. 

## 2013-08-11 NOTE — Telephone Encounter (Signed)
Call patient : Studies are unremarkable, no change in recs- no evidence at all of active sarcoidosis  Call pt: Reviewed cxr and no acute change so no change in recommendations made at ov   lmtcb

## 2013-08-11 NOTE — Telephone Encounter (Signed)
Patient no showed for appt today. Called patient and patient states she wasn't aware of the appt and could not have come anyway with her job but would like to reschedule. Told patient I would let our front office staff know and they will contact her with an appt which would probably be sometime in September. Patient verbalized understanding and had no questions

## 2013-08-11 NOTE — Progress Notes (Signed)
Quick Note:  See 8.6.15 PN ______ 

## 2014-11-20 IMAGING — US US OB COMP LESS 14 WK
1 series · 14 of 28 positions shown · non-contrast
Comparison: None.

CLINICAL DATA: Spotting, cramping.

EXAM:
OBSTETRIC <14 WK US AND TRANSVAGINAL OB US
TECHNIQUE: Both transabdominal and transvaginal ultrasound examinations were
performed for complete evaluation of the gestation as well as the
maternal uterus, adnexal regions, and pelvic cul-de-sac.
Transvaginal technique was performed to assess early pregnancy.

[Series 1: us ob comp less 14 wks · 14 of 50 slices shown]
[im 2/50]
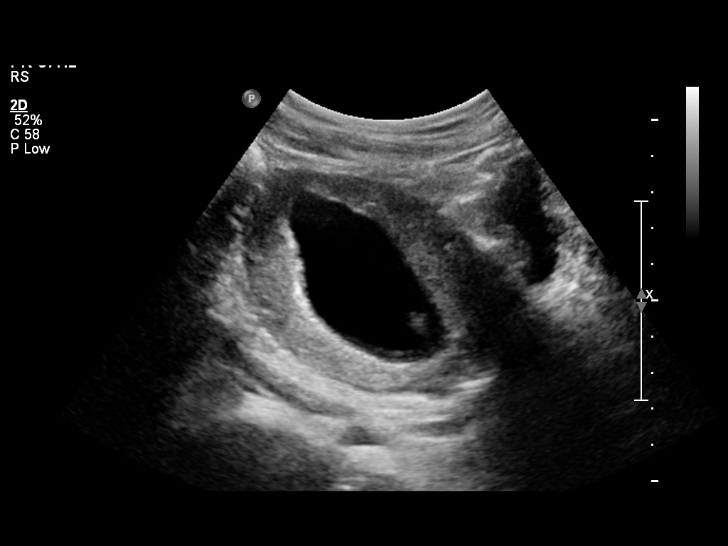
[im 6/50]
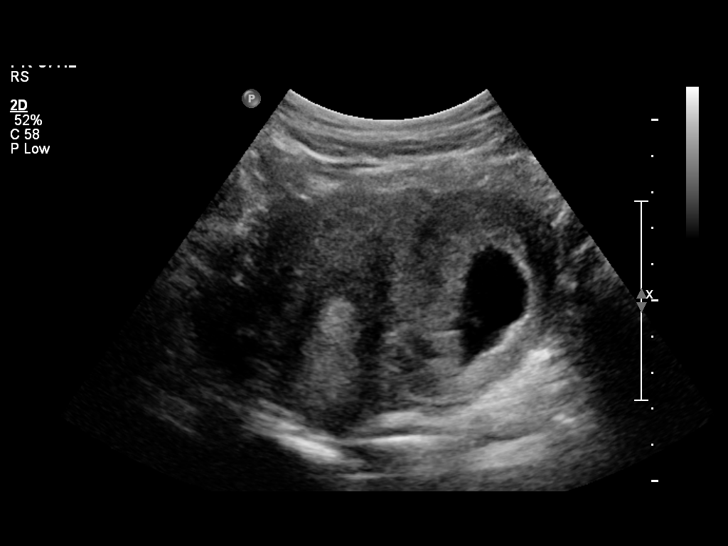
[im 10/50]
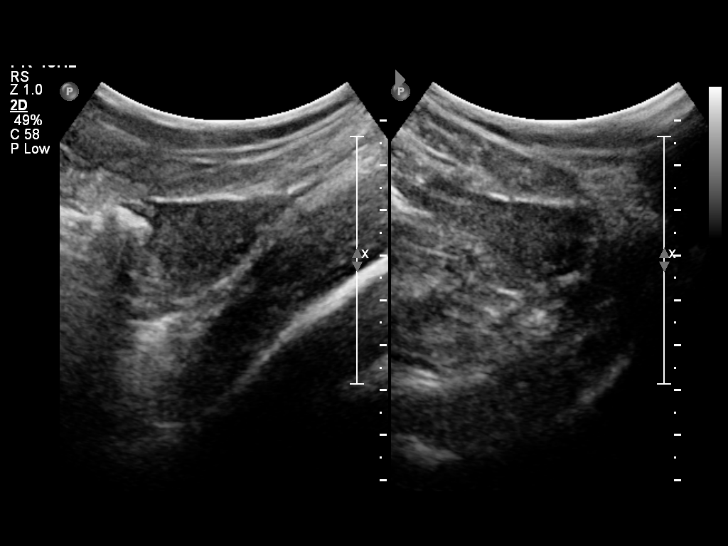
[im 13/50]
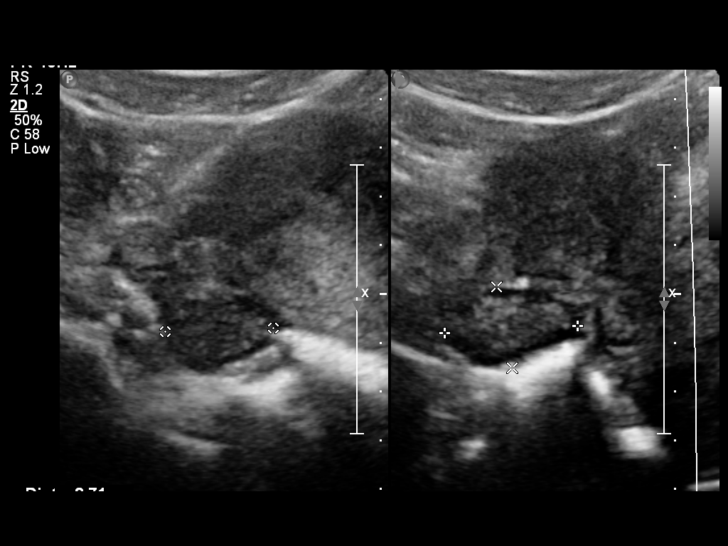
[im 17/50]
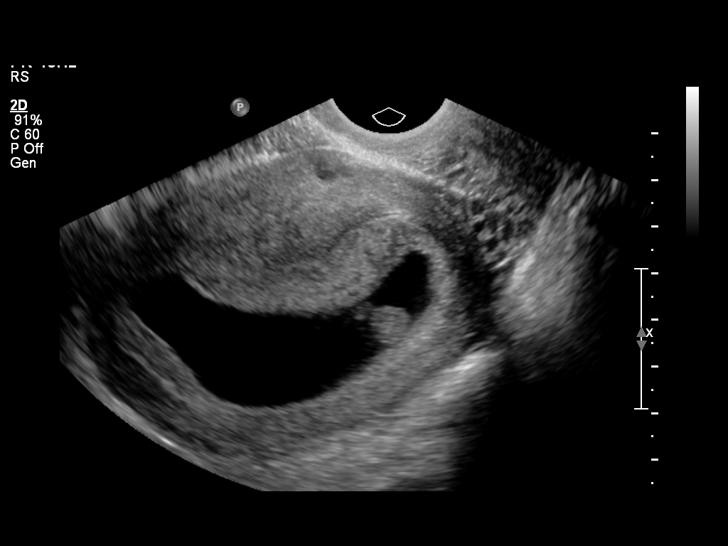
[im 20/50]
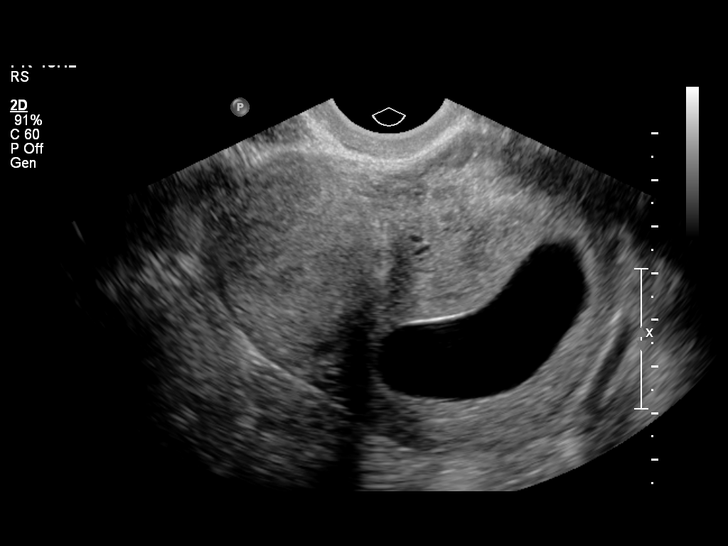
[im 24/50]
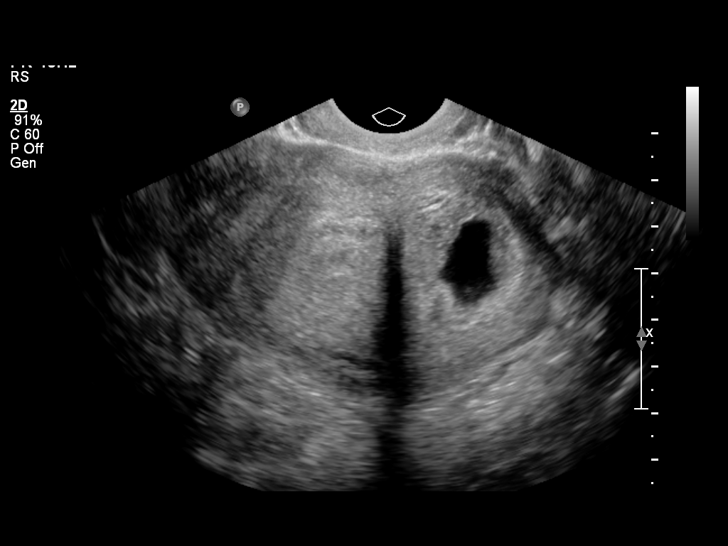
[im 28/50]
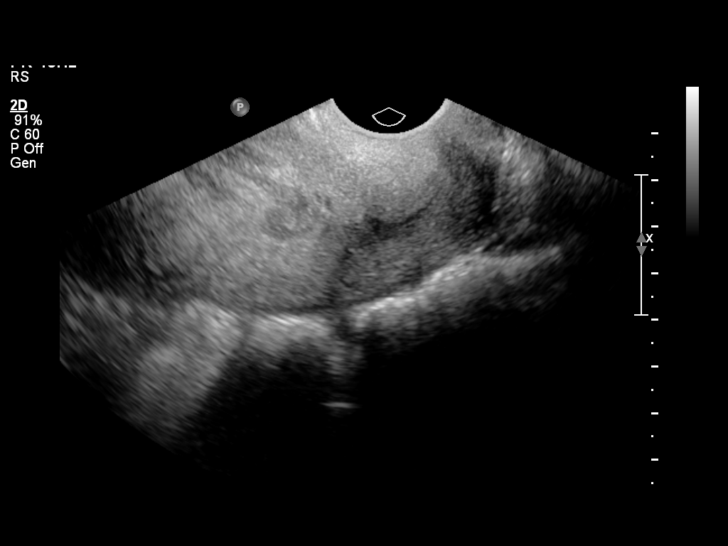
[im 31/50]
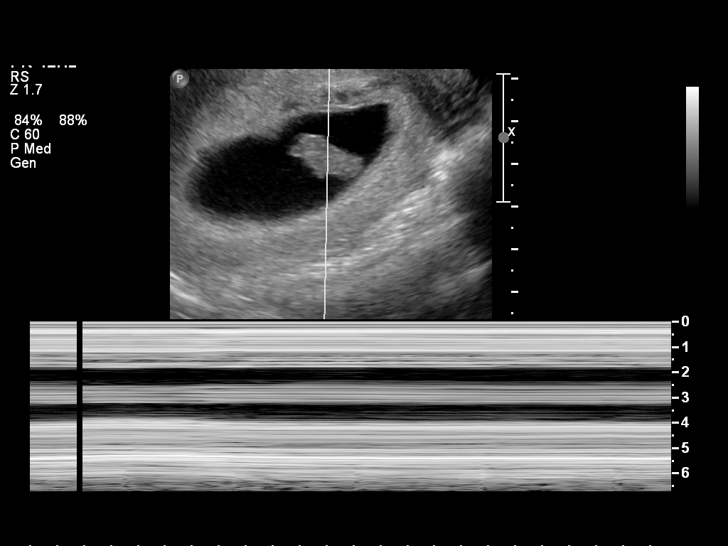
[im 35/50]
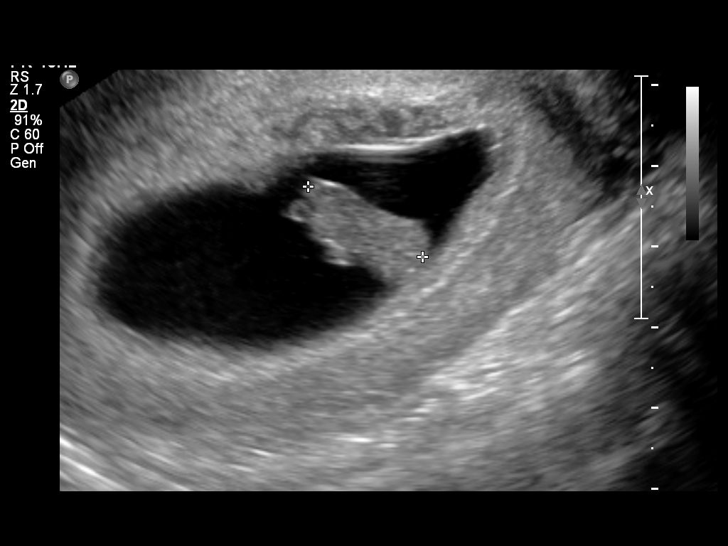
[im 39/50]
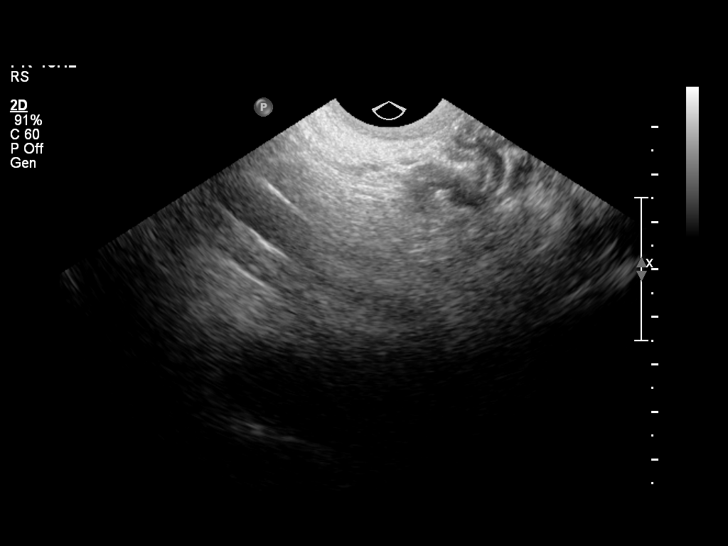
[im 42/50]
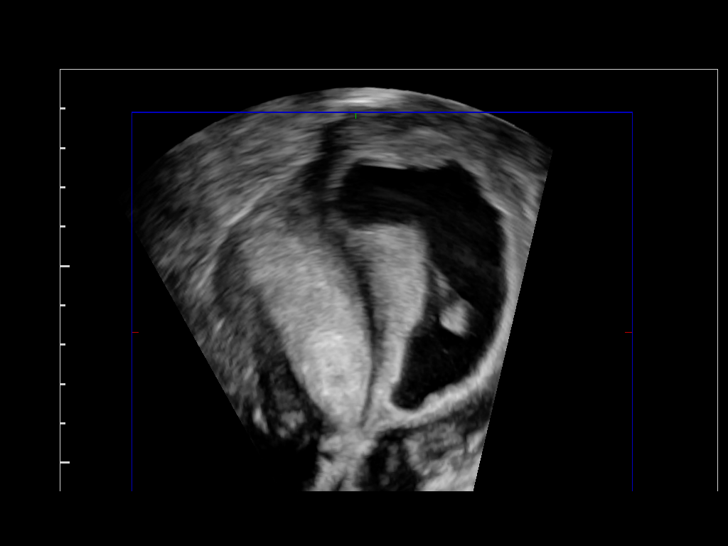
[im 46/50]
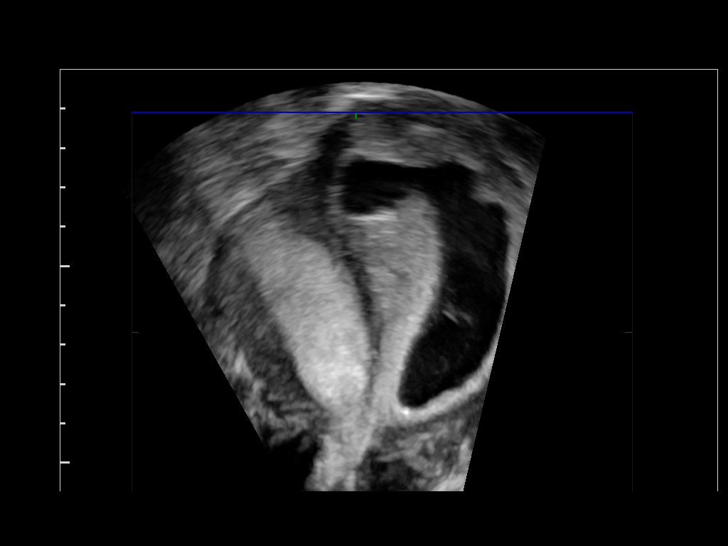
[im 50/50]
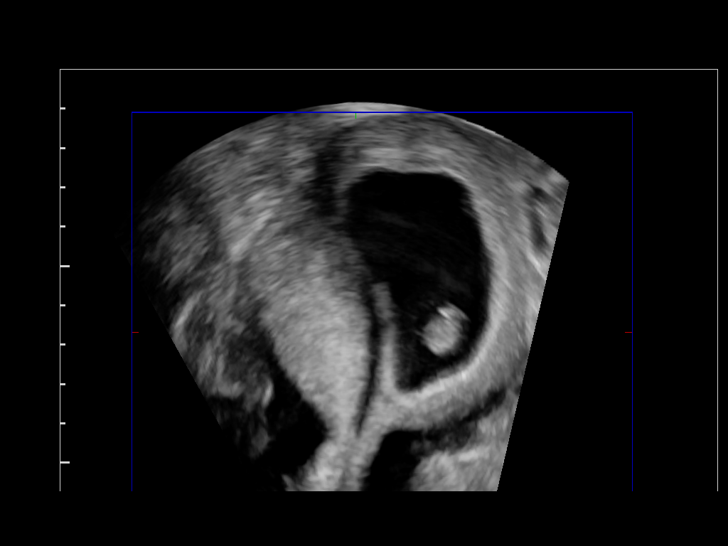

[14 of 28 positions shown; findings below may reference images not displayed]

FINDINGS: Intrauterine gestational sac: Identified, irregular in appearance,
located within the left side of a didelphys uterus

Yolk sac:  Not seen

Embryo:  Identified

Cardiac Activity: Not identified

Heart Rate:  Absent

CRL:   18  mm   8 w 2d                  US EDC: 09/10/2013

Maternal uterus/adnexae: No subchronic hemorrhage. Normal
sonographic appearance to the ovaries. No free fluid.
IMPRESSION: Single intrauterine gestation however no cardiac activity
identified. This is concerning for a nonviable pregnancy.

## 2015-03-19 ENCOUNTER — Encounter (HOSPITAL_COMMUNITY): Payer: Self-pay | Admitting: Emergency Medicine

## 2015-03-19 ENCOUNTER — Emergency Department (HOSPITAL_COMMUNITY)
Admission: EM | Admit: 2015-03-19 | Discharge: 2015-03-19 | Disposition: A | Discharge status: Home / Self Care | Payer: No Typology Code available for payment source | Attending: Emergency Medicine | Admitting: Emergency Medicine

## 2015-03-19 DIAGNOSIS — Y998 Other external cause status: Secondary | ICD-10-CM | POA: Insufficient documentation

## 2015-03-19 DIAGNOSIS — Z87891 Personal history of nicotine dependence: Secondary | ICD-10-CM | POA: Insufficient documentation

## 2015-03-19 DIAGNOSIS — S199XXA Unspecified injury of neck, initial encounter: Secondary | ICD-10-CM | POA: Insufficient documentation

## 2015-03-19 DIAGNOSIS — S3992XA Unspecified injury of lower back, initial encounter: Secondary | ICD-10-CM | POA: Diagnosis present

## 2015-03-19 DIAGNOSIS — M545 Low back pain, unspecified: Secondary | ICD-10-CM

## 2015-03-19 DIAGNOSIS — Y9389 Activity, other specified: Secondary | ICD-10-CM | POA: Diagnosis not present

## 2015-03-19 DIAGNOSIS — M542 Cervicalgia: Secondary | ICD-10-CM

## 2015-03-19 DIAGNOSIS — Y92410 Unspecified street and highway as the place of occurrence of the external cause: Secondary | ICD-10-CM | POA: Diagnosis not present

## 2015-03-19 DIAGNOSIS — Z043 Encounter for examination and observation following other accident: Secondary | ICD-10-CM

## 2015-03-19 DIAGNOSIS — Z041 Encounter for examination and observation following transport accident: Secondary | ICD-10-CM

## 2015-03-19 MED ORDER — IBUPROFEN 400 MG PO TABS
400.0000 mg | ORAL_TABLET | Freq: Once | ORAL | Status: AC
  Administered 2015-03-19: 400 mg via ORAL

## 2015-03-19 MED ORDER — IBUPROFEN 400 MG PO TABS
ORAL_TABLET | ORAL | Status: AC
  Filled 2015-03-19: qty 1

## 2015-03-19 MED ORDER — METHOCARBAMOL 500 MG PO TABS: 500.0000 mg | ORAL_TABLET | Freq: Two times a day (BID) | ORAL | Status: DC
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
## 2015-03-19 NOTE — ED Notes (Signed)
Declined W/C at D/C and was escorted to lobby by RN. 
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
## 2015-05-30 IMAGING — CR DG CHEST 2V
2 series · 2 of 2 positions shown · non-contrast
Comparison: Chest x-ray of 07/05/2012

CLINICAL DATA: History of sarcoidosis, followup

EXAM:
CHEST  2 VIEW

[view not recorded (1 of 2)]
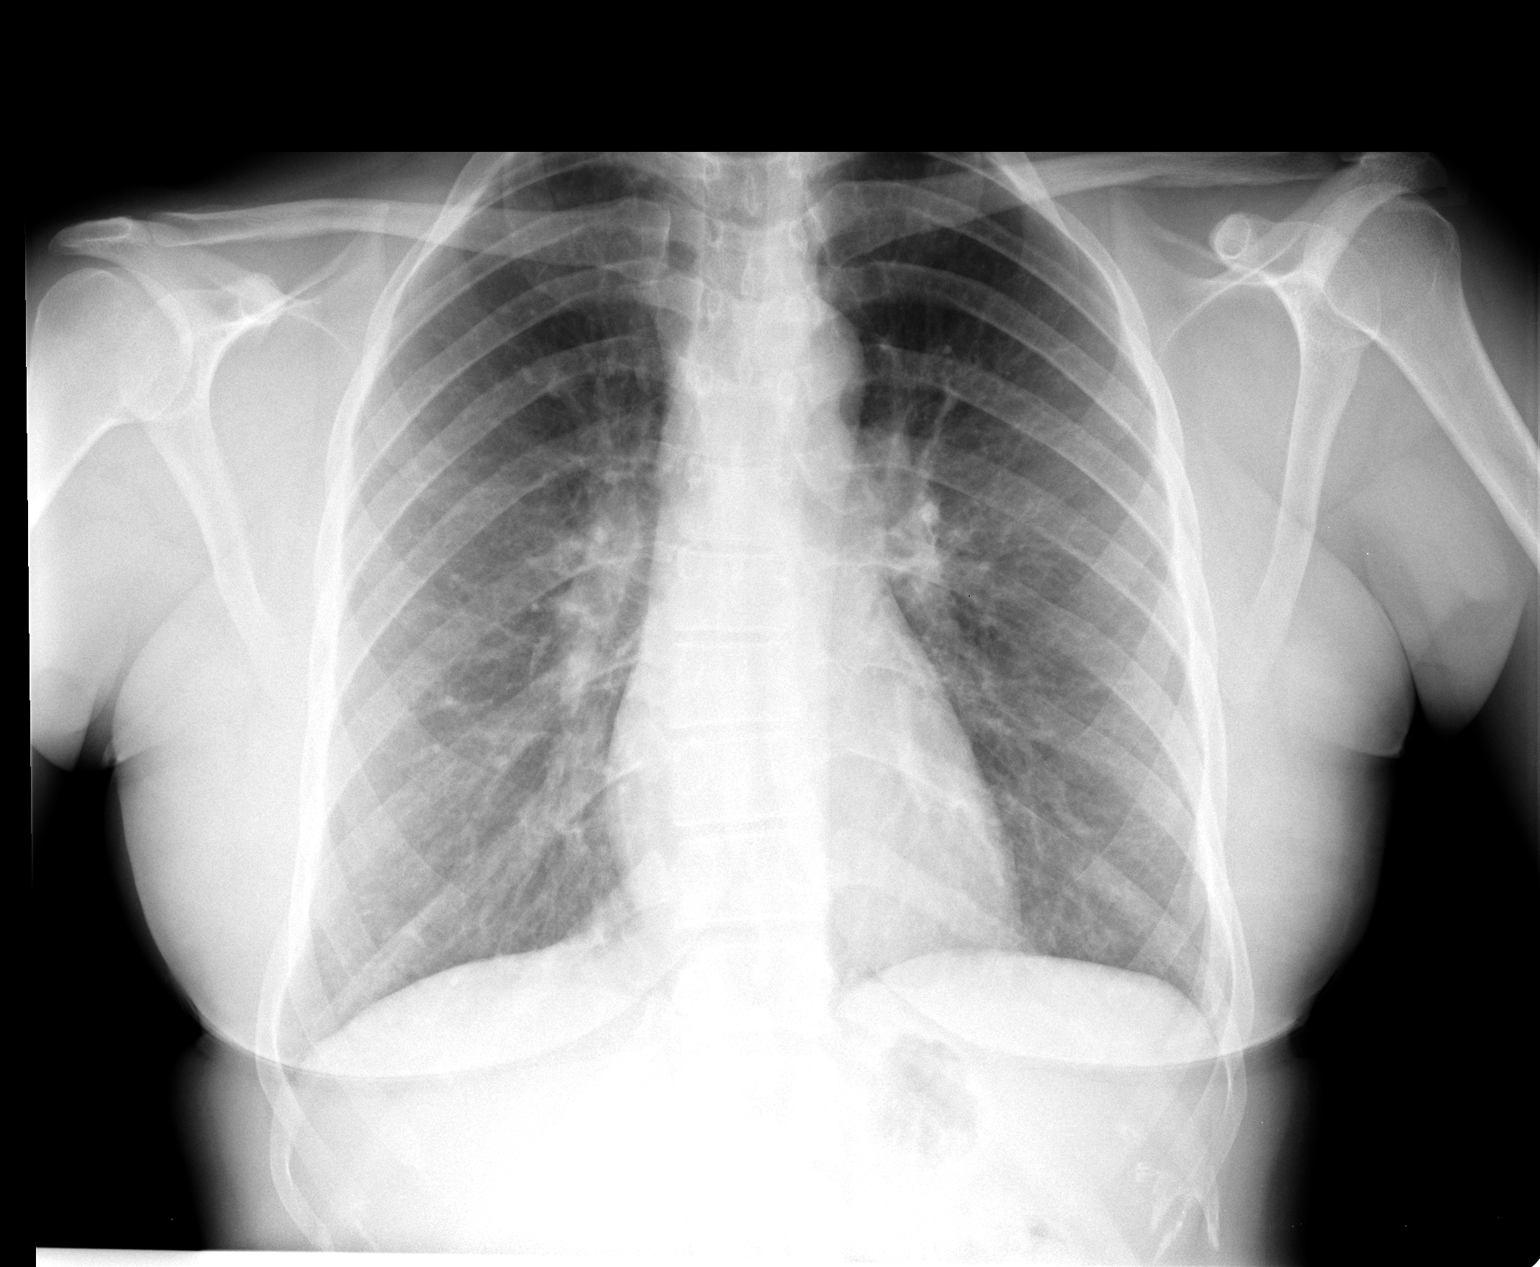

[view not recorded (2 of 2)]
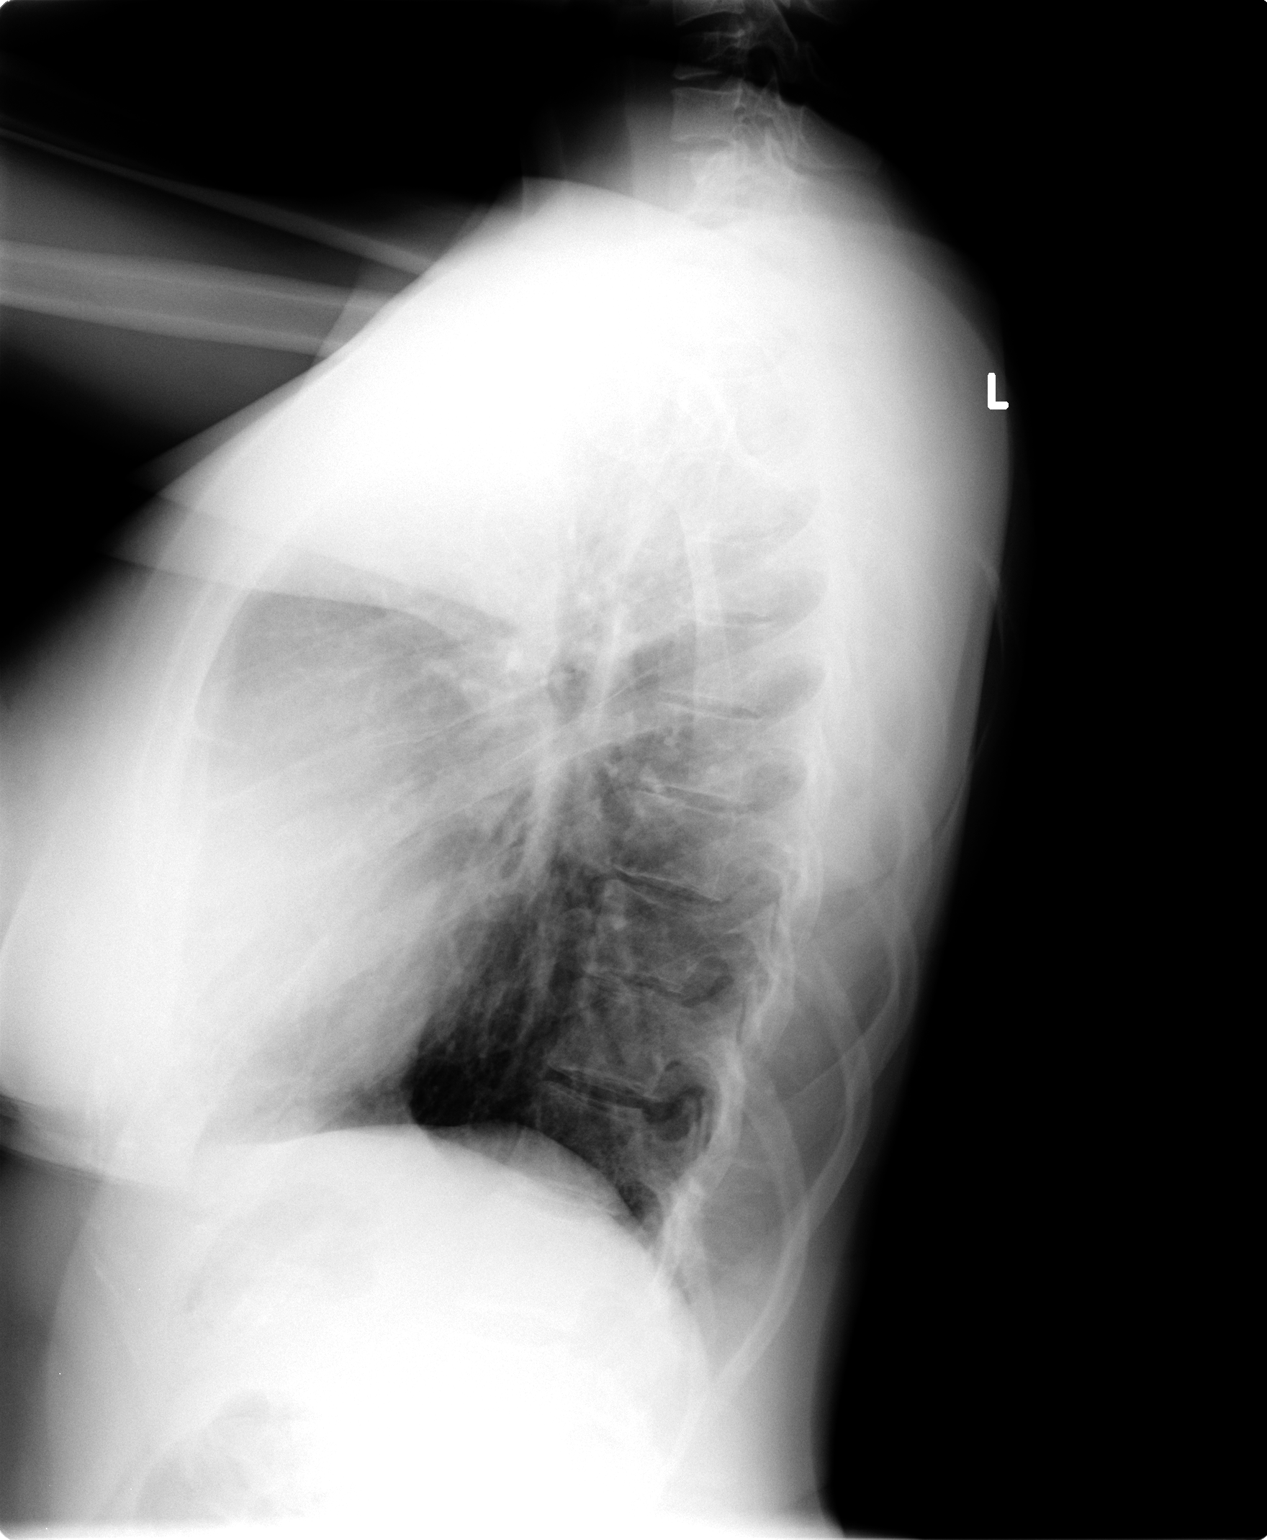

[2 of 2 positions shown; findings below may reference images not displayed]

FINDINGS: No new focal infiltrate or effusion is seen. There is no evidence of
adenopathy. The heart is within normal limits in size. No bony
abnormality is seen.
IMPRESSION: No active cardiopulmonary disease.

## 2015-09-24 ENCOUNTER — Ambulatory Visit (INDEPENDENT_AMBULATORY_CARE_PROVIDER_SITE_OTHER): Payer: 59 | Admitting: Family Medicine

## 2015-09-24 VITALS — BP 112/72 | HR 90 | Temp 98.2°F | Resp 17 | Ht 62.5 in | Wt 137.0 lb

## 2015-09-24 DIAGNOSIS — R0981 Nasal congestion: Secondary | ICD-10-CM

## 2015-09-24 DIAGNOSIS — R05 Cough: Secondary | ICD-10-CM | POA: Diagnosis not present

## 2015-09-24 DIAGNOSIS — R059 Cough, unspecified: Secondary | ICD-10-CM

## 2015-09-24 NOTE — Progress Notes (Signed)
   Patient ID: Katelyn Stevens, female    DOB: 1980/08/23, 35 y.o.   MRN: [id number]  PCP: Pcp Not In System  Chief Complaint  Patient presents with  . Cough    Productive since wednesday   . Nasal Congestion    Subjective:   HPI [age] year female presents for evaluation of cough and nasal congestion times 5 days. She reports that she has been coughing since Wednesday a productive greenish, thick, phlegm. Reports nasal congestion and post nasal drainage. She has taken Mucinex for congestion with mild relief of symptoms.  Review of Systems  See HPI   Patient Active Problem List   Diagnosis Date Noted  . Health care maintenance 01/10/2013  . Sarcoidosis (HCC) 08/22/2011  . Chronic rhinitis 08/22/2011     Prior to Admission medications   Medication Sig Start Date End Date Taking? Authorizing Provider  acetaminophen (TYLENOL) 325 MG tablet Take 650 mg by mouth every 6 (six) hours as needed.   Yes Historical Provider, MD  dextromethorphan-guaiFENesin (MUCINEX DM) 30-600 MG 12hr tablet Take 1 tablet by mouth 2 (two) times daily.   Yes Historical Provider, MD  methocarbamol (ROBAXIN) 500 MG tablet Take 1 tablet (500 mg total) by mouth 2 (two) times daily. Patient not taking: Reported on 09/24/2015 03/19/15   Anselm PancoastShawn C Joy, PA-C    Allergies  Allergen Reactions  . Aspirin Other (See Comments)    Facial swelling     Objective:  Physical Exam  Constitutional: She is oriented to person, place, and time. She appears well-developed and well-nourished.  HENT:  Head: Normocephalic and atraumatic.  Right Ear: External ear normal.  Left Ear: External ear normal.  Mouth/Throat: Oropharynx is clear and moist.  Nasal turbinates erythematous and edematous  Eyes: Conjunctivae and EOM are normal. Pupils are equal, round, and reactive to light.  Neck: Normal range of motion.  Cardiovascular: Normal rate, regular rhythm, normal heart sounds and intact distal pulses.   Pulmonary/Chest: Effort  normal and breath sounds normal.  Musculoskeletal: Normal range of motion.  Neurological: She is alert and oriented to person, place, and time.  Skin: Skin is warm and dry.  Psychiatric: She has a normal mood and affect. Her behavior is normal. Judgment and thought content normal.   Vitals:   09/24/15 1702  BP: 112/72  Pulse: 90  Resp: 17  Temp: 98.2 F (36.8 C)    Assessment & Plan:  1. Cough 2. Nasal congestion Symptoms are likely related to viral and or allergy symptoms. Treating symptomatically for now.  Plan: . dextromethorphan-guaiFENesin (MUCINEX DM) 30-600 MG 12hr tablet    Sig: Take 1 tablet by mouth 2 (two) times daily.   If symptoms persists or worsen greater than 3-5 days, will consider antibiotic treatment.  Godfrey PickKimberly S. Tiburcio PeaHarris, MSN, FNP-C [facility]

## 2015-09-24 NOTE — Patient Instructions (Addendum)
Start Flonase 2 puffs in each nares twice daily. Start Cetrizine 10 mg nightly as needed for congestion.  Take Mucinex twice daily 1200 mg to thin mucus.  Take Benzonatate 100-200 mg up to 3 times daily for cough.  If symptoms worsen or do not improve, please call office on Thursday.    IF you received an x-ray today, you will receive an invoice from [facility]. Please contact [facility] at [phone] with questions or concerns regarding your invoice.   IF you received labwork today, you will receive an invoice from [facility]. Please contact [facility] at [phone] with questions or concerns regarding your invoice.   Our billing staff will not be able to assist you with questions regarding bills from these companies.  You will be contacted with the lab results as soon as they are available. The fastest way to get your results is to activate your My Chart account. Instructions are located on the last page of this paperwork. If you have not heard from us regarding the results in 2 weeks, please contact this office.    Upper Respiratory Infection, Adult Most upper respiratory infections (URIs) are a viral infection of the air passages leading to the lungs. A URI affects the nose, throat, and upper air passages. The most common type of URI is nasopharyngitis and is typically referred to as "the common cold." URIs run their course and usually go away on their own. Most of the time, a URI does not require medical attention, but sometimes a bacterial infection in the upper airways can follow a viral infection. This is called a secondary infection. Sinus and middle ear infections are common types of secondary upper respiratory infections. Bacterial pneumonia can also complicate a URI. A URI can worsen asthma and chronic obstructive pulmonary disease (COPD). Sometimes, these complications can require emergency medical care and may be life threatening.   CAUSES Almost all URIs are caused by viruses. A virus is a type of germ and can spread from one person to another.  RISKS FACTORS You may be at risk for a URI if:   You smoke.   You have chronic heart or lung disease.  You have a weakened defense (immune) system.   You are very young or very old.   You have nasal allergies or asthma.  You work in crowded or poorly ventilated areas.  You work in health care facilities or schools. SIGNS AND SYMPTOMS  Symptoms typically develop 2-3 days after you come in contact with a cold virus. Most viral URIs last 7-10 days. However, viral URIs from the influenza virus (flu virus) can last 14-18 days and are typically more severe. Symptoms may include:   Runny or stuffy (congested) nose.   Sneezing.   Cough.   Sore throat.   Headache.   Fatigue.   Fever.   Loss of appetite.   Pain in your forehead, behind your eyes, and over your cheekbones (sinus pain).  Muscle aches.  DIAGNOSIS  Your health care provider may diagnose a URI by:  Physical exam.  Tests to check that your symptoms are not due to another condition such as:  Strep throat.  Sinusitis.  Pneumonia.  Asthma. TREATMENT  A URI goes away on its own with time. It cannot be cured with medicines, but medicines may be prescribed or recommended to relieve symptoms. Medicines may help:  Reduce your fever.  Reduce your cough.  Relieve nasal congestion. HOME CARE INSTRUCTIONS   Take medicines only  as directed by your health care provider.   Gargle warm saltwater or take cough drops to comfort your throat as directed by your health care provider.  Use a warm mist humidifier or inhale steam from a shower to increase air moisture. This may make it easier to breathe.  Drink enough fluid to keep your urine clear or pale yellow.   Eat soups and other clear broths and maintain good nutrition.   Rest as needed.   Return to work when your temperature  has returned to normal or as your health care provider advises. You may need to stay home longer to avoid infecting others. You can also use a face mask and careful hand washing to prevent spread of the virus.  Increase the usage of your inhaler if you have asthma.   Do not use any tobacco products, including cigarettes, chewing tobacco, or electronic cigarettes. If you need help quitting, ask your health care provider. PREVENTION  The best way to protect yourself from getting a cold is to practice good hygiene.   Avoid oral or hand contact with people with cold symptoms.   Wash your hands often if contact occurs.  There is no clear evidence that vitamin C, vitamin E, echinacea, or exercise reduces the chance of developing a cold. However, it is always recommended to get plenty of rest, exercise, and practice good nutrition.  SEEK MEDICAL CARE IF:   You are getting worse rather than better.   Your symptoms are not controlled by medicine.   You have chills.  You have worsening shortness of breath.  You have brown or red mucus.  You have yellow or brown nasal discharge.  You have pain in your face, especially when you bend forward.  You have a fever.  You have swollen neck glands.  You have pain while swallowing.  You have white areas in the back of your throat. SEEK IMMEDIATE MEDICAL CARE IF:   You have severe or persistent:  Headache.  Ear pain.  Sinus pain.  Chest pain.  You have chronic lung disease and any of the following:  Wheezing.  Prolonged cough.  Coughing up blood.  A change in your usual mucus.  You have a stiff neck.  You have changes in your:  Vision.  Hearing.  Thinking.  Mood. MAKE SURE YOU:   Understand these instructions.  Will watch your condition.  Will get help right away if you are not doing well or get worse.   This information is not intended to replace advice given to you by your health care provider. Make sure  you discuss any questions you have with your health care provider.   Document Released: 06/18/2000 Document Revised: 05/09/2014 Document Reviewed: 03/30/2013 Elsevier Interactive Patient Education [employer] Elsevier Inc.

## 2015-09-27 ENCOUNTER — Telehealth: Payer: Self-pay

## 2015-09-27 MED ORDER — AMOXICILLIN 875 MG PO TABS: 1750.0000 mg | ORAL_TABLET | Freq: Two times a day (BID) | ORAL | 0 refills | Status: DC

## 2015-09-27 NOTE — Telephone Encounter (Signed)
Please call and advise patient that I have submitted a prescription for Amoxicillin 875, twice daily for 5 days for acute bronchitis.  Advise her to follow-up if symptoms do not resolve once medication has been completed.  She also should continue the Mucinex twice daily.  Joaquin CourtsKimberly Karrin Eisenmenger, FNP-C

## 2015-09-27 NOTE — Telephone Encounter (Signed)
Pt is coughing up thick yellow mucus and not feeling better. Pt states that the provider said that if she was not feeling better to call back and they will prescribe antibiotic. Pt does not want to wait til weekend to get these meds.  Please advise  [phone]

## 2015-09-27 NOTE — Telephone Encounter (Signed)
Detailed voiced message left about meds. Phone number left to call back if any questions.

## 2015-12-05 ENCOUNTER — Ambulatory Visit (INDEPENDENT_AMBULATORY_CARE_PROVIDER_SITE_OTHER): Payer: 59 | Admitting: Physician Assistant

## 2015-12-05 VITALS — BP 108/76 | HR 75 | Temp 98.6°F | Resp 16 | Ht 62.0 in | Wt 138.0 lb

## 2015-12-05 DIAGNOSIS — R05 Cough: Secondary | ICD-10-CM

## 2015-12-05 DIAGNOSIS — R059 Cough, unspecified: Secondary | ICD-10-CM

## 2015-12-05 MED ORDER — PREDNISONE 20 MG PO TABS: ORAL_TABLET | ORAL | 0 refills | Status: AC

## 2015-12-05 NOTE — Patient Instructions (Addendum)
Please wait until day twn before starting prednisone.     IF you received an x-ray today, you will receive an invoice from [facility]. Please contact [facility] at [phone] with questions or concerns regarding your invoice.   IF you received labwork today, you will receive an invoice from [facility]. Please contact [facility] at [phone] with questions or concerns regarding your invoice.   Our billing staff will not be able to assist you with questions regarding bills from these companies.  You will be contacted with the lab results as soon as they are available. The fastest way to get your results is to activate your My Chart account. Instructions are located on the last page of this paperwork. If you have not heard from us regarding the results in 2 weeks, please contact this office.

## 2015-12-05 NOTE — Progress Notes (Addendum)
   12/05/2015 5:42 PM   DOB: 10-26-1980 / MRN: [id number]  SUBJECTIVE:  Katelyn Stevens is a 35 y.o. female presenting for cough that started 4 days ago.  She associates HA and nasal congestion.  States the cough is productive.  She denies fever, chills, night sweats.  She eating and drinking normally for her. She has a history of sarcoidosis but has had two negative chest rads, and an assumption was made that she had sarcoidosis based of of clinical suspicion. She has a three pack year history of smoking. She denies a Hx of DM.   She is allergic to aspirin.   She  has a past medical history of Sarcoidosis (HCC).    She  reports that she quit smoking about 5 years ago. Her smoking use included Cigarettes. She has a 3.00 pack-year smoking history. She has never used smokeless tobacco. She reports that she drinks about 1.8 oz of alcohol per week . She reports that she does not use drugs. She  reports that she currently engages in sexual activity. She reports using the following method of birth control/protection: Pill. The patient  has a past surgical history that includes uterine didelphis.  Her family history includes Breast cancer in her mother; Liver cancer in her maternal grandmother.  Review of Systems  Constitutional: Negative for chills and fever.  Respiratory: Negative for cough.   Cardiovascular: Negative for chest pain.  Skin: Negative for itching and rash.  Neurological: Negative for dizziness.    The problem list and medications were reviewed and updated by myself where necessary and exist elsewhere in the encounter.   OBJECTIVE:  BP 108/76 (BP Location: Right Arm, Patient Position: Sitting, Cuff Size: Normal)   Pulse 75   Temp 98.6 F (37 C) (Oral)   Resp 16   Ht 5\' 2"  (1.575 m)   Wt 138 lb (62.6 kg)   LMP 12/02/2015 (Approximate)   SpO2 97%   BMI 25.24 kg/m   Physical Exam  Constitutional: She is oriented to person, place, and time.  Cardiovascular: Normal rate and  regular rhythm.   Pulmonary/Chest: Effort normal and breath sounds normal. No respiratory distress. She has no wheezes. She has no rales. She exhibits no tenderness.  Abdominal: Bowel sounds are normal.  Musculoskeletal: Normal range of motion.  Neurological: She is alert and oriented to person, place, and time. No cranial nerve deficit. Coordination normal.  Vitals reviewed.   No results found for this or any previous visit (from the past 72 hour(s)).  No results found.  ASSESSMENT AND PLAN  Zella BallRobin was seen today for cough, nasal congestion and headache.  Diagnoses and all orders for this visit:  Cough Comments: Likely viral, however given her questinable history of sarcoidosis I have advised that she start a course of pred if her illness is not resolving by day ten.  Orders: -     predniSONE (DELTASONE) 20 MG tablet; Take 3 in the morning for 3 days, then 2 in the morning for 3 days, and then 1 in the morning for 3 days.    The patient is advised to call or return to clinic if she does not see an improvement in symptoms, or to seek the care of the closest emergency department if she worsens with the above plan.   Deliah BostonMichael Rayona Sardinha, MHS, PA-C [facility] 12/05/2015 5:42 PM

## 2016-01-16 ENCOUNTER — Ambulatory Visit (INDEPENDENT_AMBULATORY_CARE_PROVIDER_SITE_OTHER): Payer: 59 | Admitting: Physician Assistant

## 2016-01-16 ENCOUNTER — Encounter: Payer: Self-pay | Admitting: Physician Assistant

## 2016-01-16 VITALS — BP 94/65 | HR 68 | Temp 98.9°F | Resp 16 | Ht 62.75 in | Wt 138.8 lb

## 2016-01-16 DIAGNOSIS — K0889 Other specified disorders of teeth and supporting structures: Secondary | ICD-10-CM | POA: Diagnosis not present

## 2016-01-16 MED ORDER — AMOXICILLIN-POT CLAVULANATE 875-125 MG PO TABS: 1.0000 | ORAL_TABLET | Freq: Two times a day (BID) | ORAL | 0 refills | Status: AC

## 2016-01-16 MED ORDER — TRAMADOL HCL 50 MG PO TABS: 25.0000 mg | ORAL_TABLET | Freq: Three times a day (TID) | ORAL | 0 refills | Status: DC | PRN

## 2016-01-16 NOTE — Patient Instructions (Addendum)
It is important to start your antibiotic today and call your dentist first thing in the morning even if pain is improving as these teeth remnants need to be extracted.   If you develop any worsening symptoms tonight such as worse pain, new fever, swelling, or purulent drainage seek care immediately at the ER.   Thank you for letting me participate in your health and well being.   Dental Abscess Introduction A dental abscess is pus in or around a tooth. Follow these instructions at home:  Take medicines only as told by your dentist.  If you were prescribed antibiotic medicine, finish all of it even if you start to feel better.  Rinse your mouth (gargle) often with salt water.  Do not drive or use heavy machinery, like a lawn mower, while taking pain medicine.  Do not apply heat to the outside of your mouth.  Keep all follow-up visits as told by your dentist. This is important. Contact a doctor if:  Your pain is worse, and medicine does not help. Get help right away if:  You have a fever or chills.  Your symptoms suddenly get worse.  You have a very bad headache.  You have problems breathing or swallowing.  You have trouble opening your mouth.  You have puffiness (swelling) in your neck or around your eye. This information is not intended to replace advice given to you by your health care provider. Make sure you discuss any questions you have with your health care provider. Document Released: 05/09/2014 Document Revised: 05/31/2015 Document Reviewed: 12/20/2013  2017 Elsevier   IF you received an x-ray today, you will receive an invoice from [facility]. Please contact [facility] at [phone] with questions or concerns regarding your invoice.   IF you received labwork today, you will receive an invoice from [facility]. Please contact [facility] at [phone] with questions or concerns regarding your invoice.   Our billing staff will not be able to  assist you with questions regarding bills from these companies.  You will be contacted with the lab results as soon as they are available. The fastest way to get your results is to activate your My Chart account. Instructions are located on the last page of this paperwork. If you have not heard from us regarding the results in 2 weeks, please contact this office.

## 2016-01-16 NOTE — Progress Notes (Signed)
Katelyn Stevens  MRN: [id number] DOB: Mar 06, 1980  Subjective:  Katelyn Stevens is a 36 y.o. female seen in office today for a chief complaint of toothache x 2.5 days. Has associated swelling in gum. Denies purulent drainage, fever, chills, and diaphoresis. Has history of dental issues. She is currently working through $16,000 worth of dental work. This particular tooth pain is located near two of her broken teeth that needs extraction and bridge. Her dentist was closed today which is why she came here. Has tried ibuprofen with mild relief.   Review of Systems  HENT: Positive for sinus pressure ( left sided ). Negative for congestion, rhinorrhea, sore throat and trouble swallowing.   Eyes: Negative for pain and visual disturbance.  Respiratory: Negative for cough and shortness of breath.     Patient Active Problem List   Diagnosis Date Noted  . Health care maintenance 01/10/2013  . Sarcoidosis (HCC) 08/22/2011  . Chronic rhinitis 08/22/2011    Current Outpatient Prescriptions on File Prior to Visit  Medication Sig Dispense Refill  . Pseudoephedrine-APAP-DM (DAYQUIL PO) Take by mouth.    . Triamcinolone Acetonide (NASACORT AQ NA) Place into the nose.     No current facility-administered medications on file prior to visit.     Allergies  Allergen Reactions  . Aspirin Other (See Comments)    Facial swelling     Objective:  BP 94/65   Pulse 68   Temp 98.9 F (37.2 C) (Oral)   Resp 16   Ht 5' 2.75" (1.594 m)   Wt 138 lb 12.8 oz (63 kg)   LMP 01/09/2016   SpO2 99%   BMI 24.78 kg/m   Physical Exam  Constitutional: She is oriented to person, place, and time. She appears distressed (in moderate pain, lying back on exam table).  HENT:  Head: Normocephalic and atraumatic.  Right Ear: Tympanic membrane, external ear and ear canal normal.  Left Ear: Tympanic membrane, external ear and ear canal normal.  Nose: Right sinus exhibits no maxillary sinus tenderness and no frontal sinus  tenderness. Left sinus exhibits maxillary sinus tenderness. Left sinus exhibits no frontal sinus tenderness.  Mouth/Throat: Uvula is midline, oropharynx is clear and moist and mucous membranes are normal.    Eyes: Conjunctivae are normal.  Neck: Normal range of motion.  Pulmonary/Chest: Effort normal.  Neurological: She is alert and oriented to person, place, and time. Gait normal.  Skin: Skin is warm and dry.  Psychiatric: Affect normal.  Vitals reviewed.   Assessment and Plan :   1. Tooth pain -Will cover for potential underlying dental abscess with antibiotics. Pt instructed to contact her dentist first thing in the morning. Seek care immediately tonight if she develops any worsening pain, fever, chills, or facial swelling.  - amoxicillin-clavulanate (AUGMENTIN) 875-125 MG tablet; Take 1 tablet by mouth 2 (two) times daily.  Dispense: 14 tablet; Refill: 0 - traMADol (ULTRAM) 50 MG tablet; Take 0.5-1 tablets (25-50 mg total) by mouth every 8 (eight) hours as needed.  Dispense: 20 tablet; Refill: 0   Benjiman Core PA-C  [facility] 01/16/2016 4:45 PM

## 2017-06-29 ENCOUNTER — Ambulatory Visit: Payer: 59 | Admitting: Physician Assistant

## 2017-06-29 ENCOUNTER — Other Ambulatory Visit: Payer: Self-pay

## 2017-06-29 ENCOUNTER — Encounter: Payer: Self-pay | Admitting: Physician Assistant

## 2017-06-29 VITALS — BP 110/70 | HR 75 | Temp 98.8°F | Resp 16 | Ht 62.75 in | Wt 128.0 lb

## 2017-06-29 DIAGNOSIS — S90861A Insect bite (nonvenomous), right foot, initial encounter: Secondary | ICD-10-CM

## 2017-06-29 DIAGNOSIS — W57XXXA Bitten or stung by nonvenomous insect and other nonvenomous arthropods, initial encounter: Secondary | ICD-10-CM

## 2017-06-29 MED ORDER — RANITIDINE HCL 150 MG PO TABS: 150.0000 mg | ORAL_TABLET | Freq: Two times a day (BID) | ORAL | 0 refills | Status: DC

## 2017-06-29 MED ORDER — DIPHENHYDRAMINE HCL 50 MG PO TABS: 25.0000 mg | ORAL_TABLET | Freq: Every evening | ORAL | 0 refills | Status: DC | PRN

## 2017-06-29 NOTE — Patient Instructions (Addendum)
This is all this is likely local reaction to insect bite.  I recommend taking Zyrtec once daily and Zantac twice daily.  Take Benadryl 1 to 2 tablets at nighttime for itching.  Apply ice pack to the affected area 4-5 times a day for 20 minutes at a time. We marked off the redness today,if the redness and swelling start to go beyond this marker, you develop black flaking skin, or systemic symptoms like fever, chills, nausea, vomiting, difficult to breathing, shortness of breath, or chest pain, please seek care immediately. Otherwise follow-up in 48 hours for reevaluation. Insect Bite, Adult An insect bite can make your skin red, itchy, and swollen. Some insects can spread disease to people with a bite. However, most insect bites do not lead to disease, and most are not serious. Follow these instructions at home: Bite area care  Do not scratch the bite area.  Keep the bite area clean and dry.  Wash the bite area every day with soap and water as told by your doctor.  Check the bite area every day for signs of infection. Check for: ? More redness, swelling, or pain. ? Fluid or blood. ? Warmth. ? Pus. Managing pain, itching, and swelling  You may put any of these on the bite area as told by your doctor: ? A baking soda paste. ? Cortisone cream. ? Calamine lotion.  If directed, put ice on the bite area. ? Put ice in a plastic bag. ? Place a towel between your skin and the bag. ? Leave the ice on for 20 minutes, 2-3 times a day. Medicines  Take medicines or put medicines on your skin only as told by your doctor.  If you were prescribed an antibiotic medicine, use it as told by your doctor. Do not stop using the antibiotic even if your condition improves. General instructions  Keep all follow-up visits as told by your doctor. This is important. How is this prevented? To help you have a lower risk of insect bites:  When you are outside, wear clothing that covers your arms and  legs.  Use insect repellent. The best insect repellents have: ? An active ingredient of DEET, picaridin, oil of lemon eucalyptus (OLE), or IR3535. ? Higher amounts of DEET or another active ingredient than other repellents have.  If your home windows do not have screens, think about putting some in.  Contact a doctor if:  You have more redness, swelling, or pain in the bite area.  You have fluid, blood, or pus coming from the bite area.  The bite area feels warm.  You have a fever. Get help right away if:  You have joint pain.  You have a rash.  You have shortness of breath.  You feel more tired or sleepy than you normally do.  You have neck pain.  You have a headache.  You feel weaker than you normally do.  You have chest pain.  You have pain in your belly.  You feel sick to your stomach (nauseous) or you throw up (vomit). Summary  An insect bite can make your skin red, itchy, and swollen.  Do not scratch the bite area, and keep it clean and dry.  Ice can help with pain and itching from the bite. This information is not intended to replace advice given to you by your health care provider. Make sure you discuss any questions you have with your health care provider. Document Released: 12/21/1999 Document Revised: 07/26/2015 Document Reviewed: 05/10/2014 Elsevier  Interactive Patient Education  [employer]2018 Elsevier Inc.    IF you received an x-ray today, you will receive an invoice from [facility]. Please contact [facility] at [phone] with questions or concerns regarding your invoice.   IF you received labwork today, you will receive an invoice from [facility]. Please contact [facility] at [phone] with questions or concerns regarding your invoice.   Our billing staff will not be able to assist you with questions regarding bills from these companies.  You will be contacted with the lab results as soon as they are available. The fastest way to  get your results is to activate your My Chart account. Instructions are located on the last page of this paperwork. If you have not heard from us regarding the results in 2 weeks, please contact this office.

## 2017-06-29 NOTE — Progress Notes (Signed)
Katelyn Stevens  MRN: [id number] DOB: December 16, 1980  Subjective:  Katelyn Stevens is a 37 y.o. female seen in office today for a chief complaint of insect bite to right foot right 3 days ago while sitting in a coffee shop.  Did not see what bit her.  Had immediate pain, redness, and swelling.  Swelling progressed over the next couple days and peaked last night.  Has associated itching.  She has not tried anything for relief but notes that the itching, swelling, and redness have all improved since last night.  Denies fever, chills, nausea, vomiting, headache, joint pain, diaphoresis, headache, numbness, tingling, chest pain, trouble swallowing, difficulty breathing.  Past medical history of allergy to aspirin.  No other allergies she is aware of.  Review of Systems Per HPI  Patient Active Problem List   Diagnosis Date Noted  . Health care maintenance 01/10/2013  . Sarcoidosis 08/22/2011  . Chronic rhinitis 08/22/2011    Current Outpatient Medications on File Prior to Visit  Medication Sig Dispense Refill  . ibuprofen (ADVIL,MOTRIN) 200 MG tablet Take 200 mg by mouth every 6 (six) hours as needed.    . Pseudoephedrine-APAP-DM (DAYQUIL PO) Take by mouth.    . traMADol (ULTRAM) 50 MG tablet Take 0.5-1 tablets (25-50 mg total) by mouth every 8 (eight) hours as needed. (Patient not taking: Reported on 06/29/2017) 20 tablet 0  . Triamcinolone Acetonide (NASACORT AQ NA) Place into the nose.     No current facility-administered medications on file prior to visit.     Allergies  Allergen Reactions  . Aspirin Other (See Comments)    Facial swelling     Objective:  BP 110/70   Pulse 75   Temp 98.8 F (37.1 C)   Resp 16   Ht 5' 2.75" (1.594 m)   Wt 128 lb (58.1 kg)   SpO2 96%   BMI 22.86 kg/m   Physical Exam  Constitutional: She is oriented to person, place, and time. She appears well-developed and well-nourished.  HENT:  Head: Normocephalic and atraumatic.  Eyes: Conjunctivae are  normal.  Neck: Normal range of motion.  Cardiovascular:  Pulses:      Dorsalis pedis pulses are 2+ on the right side, and 2+ on the left side.       Posterior tibial pulses are 2+ on the right side, and 2+ on the left side.  Pulmonary/Chest: Effort normal.  Neurological: She is alert and oriented to person, place, and time.  Skin: Skin is warm and dry.     Psychiatric: She has a normal mood and affect.  Vitals reviewed.    Affected area marked with surgical marker.   Assessment and Plan :  Insect bite of right foot, initial encounter - Plan: ranitidine (ZANTAC) 150 MG tablet, diphenhydrAMINE (BENADRYL) 50 MG tablet Suspect local reaction to inse with Ct bite.  No systemic signs or symptoms noted.  Vital stable.  Reassuring that symptoms have improved since last night. Recommended ice, elevation, daily  Zyrtec and Zantac, and Benadryl at night.  Do not suspect overlying secondary bacterial infection at this time.  Will have close follow-up.  Advised to follow-up in 48 hours.  If symptoms worsen or develop new concerning symptoms, return sooner.   Benjiman Core PA-C  Primary Care at [facility] 06/29/2017 11:40 AM

## 2017-07-01 ENCOUNTER — Other Ambulatory Visit: Payer: Self-pay

## 2017-07-01 ENCOUNTER — Ambulatory Visit (INDEPENDENT_AMBULATORY_CARE_PROVIDER_SITE_OTHER): Payer: 59 | Admitting: Physician Assistant

## 2017-07-01 ENCOUNTER — Encounter: Payer: Self-pay | Admitting: Physician Assistant

## 2017-07-01 VITALS — BP 102/73 | HR 79 | Temp 98.9°F | Resp 18 | Ht 63.19 in | Wt 129.4 lb

## 2017-07-01 DIAGNOSIS — S90861D Insect bite (nonvenomous), right foot, subsequent encounter: Secondary | ICD-10-CM | POA: Diagnosis not present

## 2017-07-01 DIAGNOSIS — T3695XA Adverse effect of unspecified systemic antibiotic, initial encounter: Secondary | ICD-10-CM | POA: Diagnosis not present

## 2017-07-01 DIAGNOSIS — B379 Candidiasis, unspecified: Secondary | ICD-10-CM

## 2017-07-01 DIAGNOSIS — W57XXXD Bitten or stung by nonvenomous insect and other nonvenomous arthropods, subsequent encounter: Secondary | ICD-10-CM | POA: Diagnosis not present

## 2017-07-01 MED ORDER — FLUCONAZOLE 150 MG PO TABS: 150.0000 mg | ORAL_TABLET | Freq: Once | ORAL | 0 refills | Status: AC

## 2017-07-01 MED ORDER — CEPHALEXIN 500 MG PO CAPS: 500.0000 mg | ORAL_CAPSULE | Freq: Three times a day (TID) | ORAL | 0 refills | Status: AC

## 2017-07-01 NOTE — Patient Instructions (Addendum)
We are going to cover for potential secondary infection. Please take antibiotic as prescribed. Start taking benedryl at night. Continue with ceterizine during the day. Return if any symptoms worsen or you develop new concerning symptoms. Follow up as needed.   Insect Bite, Adult An insect bite can make your skin red, itchy, and swollen. Some insects can spread disease to people with a bite. However, most insect bites do not lead to disease, and most are not serious. Follow these instructions at home: Bite area care  Do not scratch the bite area.  Keep the bite area clean and dry.  Wash the bite area every day with soap and water as told by your doctor.  Check the bite area every day for signs of infection. Check for: ? More redness, swelling, or pain. ? Fluid or blood. ? Warmth. ? Pus. Managing pain, itching, and swelling  You may put any of these on the bite area as told by your doctor: ? A baking soda paste. ? Cortisone cream. ? Calamine lotion.  If directed, put ice on the bite area. ? Put ice in a plastic bag. ? Place a towel between your skin and the bag. ? Leave the ice on for 20 minutes, 2-3 times a day. Medicines  Take medicines or put medicines on your skin only as told by your doctor.  If you were prescribed an antibiotic medicine, use it as told by your doctor. Do not stop using the antibiotic even if your condition improves. General instructions  Keep all follow-up visits as told by your doctor. This is important. How is this prevented? To help you have a lower risk of insect bites:  When you are outside, wear clothing that covers your arms and legs.  Use insect repellent. The best insect repellents have: ? An active ingredient of DEET, picaridin, oil of lemon eucalyptus (OLE), or IR3535. ? Higher amounts of DEET or another active ingredient than other repellents have.  If your home windows do not have screens, think about putting some in.  Contact a doctor  if:  You have more redness, swelling, or pain in the bite area.  You have fluid, blood, or pus coming from the bite area.  The bite area feels warm.  You have a fever. Get help right away if:  You have joint pain.  You have a rash.  You have shortness of breath.  You feel more tired or sleepy than you normally do.  You have neck pain.  You have a headache.  You feel weaker than you normally do.  You have chest pain.  You have pain in your belly.  You feel sick to your stomach (nauseous) or you throw up (vomit). Summary  An insect bite can make your skin red, itchy, and swollen.  Do not scratch the bite area, and keep it clean and dry.  Ice can help with pain and itching from the bite. This information is not intended to replace advice given to you by your health care provider. Make sure you discuss any questions you have with your health care provider. Document Released: 12/21/1999 Document Revised: 07/26/2015 Document Reviewed: 05/10/2014 Elsevier Interactive Patient Education  2018 [employer]  IF you received an x-ray today, you will receive an invoice from [facility]. Please contact [facility] at [phone] with questions or concerns regarding your invoice.   IF you received labwork today, you will receive an invoice from [facility]. Please contact [facility] at [phone] with questions or concerns  regarding your invoice.   Our billing staff will not be able to assist you with questions regarding bills from these companies.  You will be contacted with the lab results as soon as they are available. The fastest way to get your results is to activate your My Chart account. Instructions are located on the last page of this paperwork. If you have not heard from [place] regarding the results in 2 weeks, please contact this office.

## 2017-07-01 NOTE — Progress Notes (Signed)
Katelyn Stevens  MRN: [id number] DOB: 25-Jan-1980  Subjective:  Katelyn Stevens is a 37 y.o. female seen in office today for a chief complaint of follow-up on insect bite.  Patient initially seen on 06/29/2017 for a bug bite had occurred 3 days prior.  Had swelling, erythema, and warmth of the dorsum of her right foot.  Has associated itching.  Recommended Benadryl, Zantac, and Zyrtec.  Encouraged to follow-up in 2 days for reevaluation.  Today, patient admits to only using Zyrtec.  Notes the swelling has improved.  There is still some erythema and it has actually spread outside the surgical marker line.  Has associated warmth.  Denies fever, chills, nausea, vomiting,  and pain.   Review of Systems  Per HPI  Patient Active Problem List   Diagnosis Date Noted  . Health care maintenance 01/10/2013  . Sarcoidosis 08/22/2011  . Chronic rhinitis 08/22/2011    Current Outpatient Medications on File Prior to Visit  Medication Sig Dispense Refill  . diphenhydrAMINE (BENADRYL) 50 MG tablet Take 0.5-1 tablets (25-50 mg total) by mouth at bedtime as needed for itching. 30 tablet 0  . ibuprofen (ADVIL,MOTRIN) 200 MG tablet Take 200 mg by mouth every 6 (six) hours as needed.    . Pseudoephedrine-APAP-DM (DAYQUIL PO) Take by mouth.    . ranitidine (ZANTAC) 150 MG tablet Take 1 tablet (150 mg total) by mouth 2 (two) times daily. 30 tablet 0  . traMADol (ULTRAM) 50 MG tablet Take 0.5-1 tablets (25-50 mg total) by mouth every 8 (eight) hours as needed. 20 tablet 0  . Triamcinolone Acetonide (NASACORT AQ NA) Place into the nose.     No current facility-administered medications on file prior to visit.     Allergies  Allergen Reactions  . Aspirin Other (See Comments)    Facial swelling     Objective:  BP 102/73 (BP Location: Left Arm, Patient Position: Sitting, Cuff Size: Normal)   Pulse 79   Temp 98.9 F (37.2 C) (Oral)   Resp 18   Ht 5' 3.19" (1.605 m)   Wt 129 lb 6.4 oz (58.7 kg)   LMP  06/20/2017 (Approximate)   SpO2 98%   BMI 22.79 kg/m   Physical Exam  Constitutional: She is oriented to person, place, and time. She appears well-developed and well-nourished. No distress.  HENT:  Head: Normocephalic and atraumatic.  Eyes: Conjunctivae are normal.  Neck: Normal range of motion.  Pulmonary/Chest: Effort normal.  Musculoskeletal:       Right foot: There is normal range of motion, no tenderness, no bony tenderness, no swelling and normal capillary refill.  Neurological: She is alert and oriented to person, place, and time.  Skin: Skin is warm and dry.     Psychiatric: She has a normal mood and affect.  Vitals reviewed.   Assessment and Plan :  1. Insect bite of right foot, subsequent encounter Swelling is improved since initial visit.  Erythema is mild bu tappears to be extending beyond surgical marker line placed at initial visit in one particular spot.  Warmth is about the same to palpation.  Due to continuing erythema and warmth, will cover for potential secondary bacterial infection at this time.  Vital stable.  Sensation and range of motion of foot intact.  Strongly encourage patient to continue with Zyrtec.  Start Benadryl.  Use ice pack to affected area.  Follow-up as needed. - cephALEXin (KEFLEX) 500 MG capsule; Take 1 capsule (500 mg total) by mouth 3 (  three) times daily for 7 days.  Dispense: 21 capsule; Refill: 0  2. Antibiotic-induced yeast infection - fluconazole (DIFLUCAN) 150 MG tablet; Take 1 tablet (150 mg total) by mouth once for 1 dose.  Dispense: 1 tablet; Refill: 0  Side effects, risks, benefits, and alternatives of the medications and treatment plan prescribed today were discussed, and patient expressed understanding of the instructions given. No barriers to understanding were identified. Red flags discussed in detail. Pt expressed understanding regarding what to do in case of emergency/urgent symptoms.  Benjiman Core PA-C  Primary Care at  [facility] 07/01/2017 5:07 PM

## 2017-11-11 ENCOUNTER — Inpatient Hospital Stay (HOSPITAL_COMMUNITY)
Admission: AD | Admit: 2017-11-11 | Discharge: 2017-11-11 | Disposition: A | Discharge status: Home / Self Care | Payer: 59 | Source: Ambulatory Visit | Attending: Family Medicine | Admitting: Family Medicine

## 2017-11-11 DIAGNOSIS — Z87891 Personal history of nicotine dependence: Secondary | ICD-10-CM | POA: Diagnosis not present

## 2017-11-11 DIAGNOSIS — K0889 Other specified disorders of teeth and supporting structures: Secondary | ICD-10-CM | POA: Diagnosis present

## 2017-11-11 DIAGNOSIS — K029 Dental caries, unspecified: Secondary | ICD-10-CM | POA: Diagnosis not present

## 2017-11-11 MED ORDER — AMOXICILLIN-POT CLAVULANATE 875-125 MG PO TABS
1.0000 | ORAL_TABLET | Freq: Two times a day (BID) | ORAL | Status: DC
  Administered 2017-11-11: 1 via ORAL
  Filled 2017-11-11: qty 1

## 2017-11-11 MED ORDER — AMOXICILLIN-POT CLAVULANATE 875-125 MG PO TABS: 1.0000 | ORAL_TABLET | Freq: Two times a day (BID) | ORAL | 0 refills | Status: AC

## 2017-11-11 MED ORDER — OXYCODONE-ACETAMINOPHEN 5-325 MG PO TABS: 1.0000 | ORAL_TABLET | ORAL | 0 refills | Status: AC | PRN

## 2017-11-11 MED ORDER — OXYCODONE-ACETAMINOPHEN 5-325 MG PO TABS
1.0000 | ORAL_TABLET | Freq: Once | ORAL | Status: AC
  Administered 2017-11-11: 1 via ORAL
  Filled 2017-11-11: qty 1

## 2017-11-11 NOTE — MAU Provider Note (Signed)
History     CSN: [id number]  Arrival date and time: 11/11/17 0320   First Provider Initiated Contact with Patient 11/11/17 0416      Chief Complaint  Patient presents with  . Dental Pain   Katelyn Stevens is a 37 y.o. G6P1 not currently pregnant who presents to MAU with complaints of dental pain and sore on left arm. She reports pain started yesterday, describes dental pain as aching and throbbing specific to her upper gums on the left side. Rates pain 9/10- has taken Tylenol and Oragel for pain with no relief. 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 Patient reports brushing teeth regularly but does not floss. She reports having a mole removed on left arm by Dermatologist last week, arm began to be painful to where stitches were being popping and "removing themselves". She was suppose to be seen for f/u on 11/6 but went yesterday where they removed the stiches and told her to keep area clean, dry and covered. Patient denies drainage or pus from area, reports f/u being "sometime this week" since they removed stitches.    OB History    Gravida  6   Para  1   Term  1   Preterm      AB  3   Living  1     SAB  2   TAB  1   Ectopic      Multiple      Live Births  1           Past Medical History:  Diagnosis Date  . Sarcoidosis     Past Surgical History:  Procedure Laterality Date  . uterine didelphis      Family History  Problem Relation Age of Onset  . Breast cancer Mother   . Liver cancer Maternal Grandmother     Social History   Tobacco Use  . Smoking status: Former Smoker    Packs/day: 0.50    Years: 6.00    Pack years: 3.00    Types: Cigarettes    Last attempt to quit: 08/07/2010    Years since quitting: 7.2  . Smokeless tobacco: Never Used  Substance Use Topics  . Alcohol use: Yes    Alcohol/week: 3.0 standard drinks    Types: 3 Shots  of liquor per week    Comment: 2 glasses a week   . Drug use: No    Allergies:  Allergies  Allergen Reactions  . Aspirin Other (See Comments)    Facial swelling    Medications Prior to Admission  Medication Sig Dispense Refill Last Dose  . diphenhydrAMINE (BENADRYL) 50 MG tablet Take 0.5-1 tablets (25-50 mg total) by mouth at bedtime as needed for itching. 30 tablet 0 Taking  . ibuprofen (ADVIL,MOTRIN) 200 MG tablet Take 200 mg by mouth every 6 (six) hours as needed.   Taking  . Pseudoephedrine-APAP-DM (DAYQUIL PO) Take by mouth.   Taking  . ranitidine (ZANTAC) 150 MG tablet Take 1 tablet (150 mg total) by mouth 2 (two) times daily. 30 tablet 0 Taking  . traMADol (ULTRAM) 50 MG tablet Take 0.5-1 tablets (25-50 mg total) by mouth every 8 (eight) hours as needed. 20 tablet 0 Taking  . Triamcinolone Acetonide (NASACORT AQ NA) Place into the nose.   Taking    Review of Systems  Constitutional: Negative.   HENT: Positive for  dental problem and mouth sores.   Respiratory: Negative.   Cardiovascular: Negative.   Gastrointestinal: Negative.   Genitourinary: Negative.   Skin: Positive for wound.   Physical Exam   Blood pressure 117/85, pulse 64, temperature 98.7 F (37.1 C), temperature source Oral, resp. rate 18, height 5\' 2"  (1.575 m), weight 59.9 kg, last menstrual period 09/17/2017, SpO2 99 %.  Physical Exam  Nursing note and vitals reviewed. Constitutional: She is oriented to person, place, and time. She appears well-developed and well-nourished. She appears distressed.  HENT:  Mouth/Throat:    Cardiovascular: Normal rate, regular rhythm and normal heart sounds.  Respiratory: Effort normal and breath sounds normal. No respiratory distress. She has no wheezes.  GI: Soft. She exhibits no distension. There is no tenderness.  Neurological: She is alert and oriented to person, place, and time.  Skin:     Psychiatric: She has a normal mood and affect. Her behavior is normal.  Thought content normal.    MAU Course  Procedures  MDM Meds ordered this encounter  Medications  . oxyCODONE-acetaminophen (PERCOCET/ROXICET) 5-325 MG per tablet 1 tablet  . amoxicillin-clavulanate (AUGMENTIN) 875-125 MG per tablet 1 tablet  . amoxicillin-clavulanate (AUGMENTIN) 875-125 MG tablet    Sig: Take 1 tablet by mouth 2 (two) times daily for 7 days.    Dispense:  14 tablet    Refill:  0    Order Specific Question:   Supervising Provider    Answer:   Levie Heritage [[id number]]  . oxyCODONE-acetaminophen (PERCOCET/ROXICET) 5-325 MG tablet    Sig: Take 1 tablet by mouth every 4 (four) hours as needed for up to 3 days for severe pain.    Dispense:  6 tablet    Refill:  0    Order Specific Question:   Supervising Provider    Answer:   Levie Heritage [[id number]]   Educated and discussed good dental hygiene and the importance of patient follow up as scheduled with Dentist for periodontal treatment, treatment of cavity and caries. Patient verbalizes understanding. Augmentin and Percocet given PO prior to discharge home. Upon reassessment patient reports pain has decreased to 5/10. Rx for Augmentin and Percocet sent to pharmacy of choice.   Encouraged patient to follow up as scheduled with dermatologist for wound from surgery. No s/s of infection, no pus drainage or fever.  Discussed reasons to return to MAU. Pt stable at time of discharge.   Assessment and Plan   1. Dental caries   2. Pain, dental   3. Tooth decay    Discharge home  Follow up as scheduled with dentist and dermatologist  Rx for Augmentin and Percocet  Discussed reasons to return to MAU   Follow-up Information    [facility] Follow up.          Allergies as of 11/11/2017      Reactions   Aspirin Other (See Comments)   Facial swelling      Medication List    TAKE these medications   amoxicillin-clavulanate 875-125 MG tablet Commonly known as:  AUGMENTIN Take 1 tablet by mouth 2 (two) times daily for  7 days.   DAYQUIL PO Take by mouth.   diphenhydrAMINE 50 MG tablet Commonly known as:  BENADRYL Take 0.5-1 tablets (25-50 mg total) by mouth at bedtime as needed for itching.   ibuprofen 200 MG tablet Commonly known as:  ADVIL,MOTRIN Take 200 mg by mouth every 6 (six) hours as needed.   NASACORT AQ NA Place into  the nose.   oxyCODONE-acetaminophen 5-325 MG tablet Commonly known as:  PERCOCET/ROXICET Take 1 tablet by mouth every 4 (four) hours as needed for up to 3 days for severe pain.   ranitidine 150 MG tablet Commonly known as:  ZANTAC Take 1 tablet (150 mg total) by mouth 2 (two) times daily.   traMADol 50 MG tablet Commonly known as:  ULTRAM Take 0.5-1 tablets (25-50 mg total) by mouth every 8 (eight) hours as needed.       Sharyon Cable 11/11/2017, 5:11 AM

## 2017-11-11 NOTE — MAU Note (Signed)
Pt here with a "spot on her left arm" that she had checked out by a dermatologist and came back as non-cancerous. States she had stitches placed and the next day her arm started itching. After that, noticed some yellow drainage and the site was "opening". Suppose to go and have stiches removed today and the stitches "removed themselves." States the site is bothering her. States now she is having pain in one of her upper left teeth. States she has been alternating tylenol and ibuprofen. States she has a Education officer, community but is not able to see her at the time. Pt thinks she needs an extraction.

## 2017-11-11 NOTE — Discharge Instructions (Signed)
Dental Caries Dental caries are spots of decay (cavities) in teeth. They are in the outer layer of your tooth (enamel). Treat them as soon as you can. If they are not treated, they can spread decay and lead to painful infection. Follow these instructions at home: General instructions  Take good care of your mouth and teeth. This keeps them healthy. ? Brush your teeth 2 times a day. Use toothpaste with fluoride in it. ? Floss your teeth once a day.  If your dentist prescribed an antibiotic medicine to treat an infection, take it as told. Do not stop taking the antibiotic even if your condition gets better.  Keep all follow-up visits as told by your dentist. This is important. This includes all cleanings. Preventing dental caries  Brush your teeth every morning and night. Use fluoride toothpaste.  Get regular dental cleanings.  If you are at risk of dental caries. ? Wash your mouth with prescription mouthwash (chlorhexidine). ? Put topical fluoride on your teeth.  Drink water with fluoride in it.  Drink water instead of sugary drinks.  Eat healthy meals and snacks. Contact a doctor if:  You have symptoms of tooth decay. Summary  Dental caries are spots of decay (cavities) in teeth. They are in the outer layer of your tooth.  Take an antibiotic to treat an infection, if told by your dentist. Do not stop taking the antibiotic even if your condition gets better.  Regular dental cleanings and brushing can help prevent dental caries. This information is not intended to replace advice given to you by your health care provider. Make sure you discuss any questions you have with your health care provider. Document Released: 10/02/2007 Document Revised: 09/09/2015 Document Reviewed: 09/09/2015 Elsevier Interactive Patient Education  2017 [employer].  

## 2018-09-27 ENCOUNTER — Telehealth: Payer: Self-pay

## 2018-09-27 ENCOUNTER — Encounter: Payer: Self-pay | Admitting: Internal Medicine

## 2018-09-27 ENCOUNTER — Ambulatory Visit: Payer: 59 | Admitting: Internal Medicine

## 2018-09-27 ENCOUNTER — Ambulatory Visit (INDEPENDENT_AMBULATORY_CARE_PROVIDER_SITE_OTHER): Payer: 59

## 2018-09-27 ENCOUNTER — Other Ambulatory Visit: Payer: Self-pay

## 2018-09-27 DIAGNOSIS — J310 Chronic rhinitis: Secondary | ICD-10-CM | POA: Diagnosis not present

## 2018-09-27 DIAGNOSIS — D869 Sarcoidosis, unspecified: Secondary | ICD-10-CM

## 2018-09-27 MED ORDER — PREDNISONE 5 MG PO TABS: ORAL_TABLET | ORAL | 0 refills | Status: DC

## 2018-09-27 NOTE — Patient Instructions (Signed)
Prednisone 10 mg x 2 daily x 5 days and 5 mg daily x 5 days and stop   Afrin / nasacort Aq as before   Please remember to go to the lab and x-ray department   for your tests - we will call you with the results when they are available.   Please schedule a follow up office visit in 6 weeks, call sooner if needed

## 2018-09-27 NOTE — Telephone Encounter (Signed)
Call returned to patient, confirmed DOB, she reports increased congestion, skin lesions, she had a skin biopsy done with [facility] dermatology, awaiting results. She reports the last time she had a flare she ended up on prednisone for a year. She is requesting recommendations from [place]. Last OV August of 2015. I made her aware she would need to be seen in office since it has been 5 years. Voiced understanding. Covid Scren negative. Appt made. Nothing further needed at this time.

## 2018-09-27 NOTE — Progress Notes (Addendum)
Subjective:    Patient ID: Katelyn Stevens, female    DOB: 04-26-1980   MRN: [id number]    Brief patient profile:  38 yobf quit smoking 2012 then starting November 2012 onset of nasal congestion then cough and sob in winter of 2013 and then lymph node swelling spring 2013 and referred 08/21/2011 to pulmonary clinic by her brother with strong fm hx of sarcoid.   History of Present Illness   08/21/2011 1st pulmonary eval cc can't breath through nose and green nasal drainage, much better p prednisone x 2 weeks, much worse off it.  Min arthralgias, min sob better on prednisone, no occular complaints or rash. rec augmentin x 10 days one twice daily (yogurt for lunch) Nasal saline irrigation and stop sinex unless you have to one side to sleep x 5 days only Prednisone 10 mg x 2 each until better then one each am Please schedule a follow up office visit in 6 weeks, call sooner if needed with cxr. I do recommend you see an MD eye doctor for a sarcoid eye exam > did not go        11/18/2011 f/u ov/Tamana Hatfield  reduced to 5 mg one daily and ok for a week, then nasal congestion flared then increased to 7.5  Then 10 mg per day x 3 days and no better with afrin. rec I emphasized that nasal steroids (nasonex)have no immediate benefit    Augmentin x 10 days one twice daily (yogurt for lunch) Prednisone 20 mg daily  until better then 10 mg per day x 5 days and then taper to qod Nasonex two puffs twice daily taper to one at bedtime if better into each nostril I do recommend you see an MD eye doctor for a sarcoid eye exam- > neg     07/05/2012 f/u ov/Afreen Siebels clinical dx of sarcoid on 5 mg qod  Chief Complaint  Patient presents with  . Follow-up    with cxr.  Breathing doing pretty good.  No complaints at this time.  on days she takes prednisone less aches in hands,  No sinus problems rash or ocular cos rec Prednisone  5mg  one half every other day x 4 weeks, then one half every 3 days for a month and then stop -  if condition worsens ok to resume previous dose    8/292014 f/u ov/Romeo Zielinski  Still on 5 mg q 3d Chief Complaint  Patient presents with  . Follow-up    Pt states that her breathing is doing well and denies any co's today.   no complaints on days misses prednisone, specifically no sob, cough, aches, ocular ccs - continues to have some nasal congestion and watery drainage at hs despite nasal steroids.  rec Prednisone 5 mg one half every 3 days x sev weeks then stop Try chlortrimeton 4 mg at bedtime as needed for nasal congestion     01/10/2013 f/u ov/Klaudia Beirne re: ? Steroid dep sarcoid Chief Complaint  Patient presents with  . Follow-up    Pt states doing well and denies any co's today.    no rash, sob, cough  rec Prednisone 2.5 mg every other day x 2 weeks and stop and see if any symptoms flare (cough, short of breath, nausea, aches) - call if occurs  Please see patient coordinator before you leave today  to schedule GYN evaluation/ establish regular follow up Please schedule a follow up office visit in 6 weeks, call sooner if needed with pfts and cxr  08/09/2013 f/u ov/Savior Himebaugh re: f/u sarcoid no rx / never got tissue dx  Chief Complaint  Patient presents with  . Follow-up    Pt states that overall she is doing well. She states that her lymph nodes under her chin feel like they may be swollen.   No ocular, artricular complaints or rash, sob. rec Please remember to go to the lab and x-ray department downstairs for your tests - we will call you with the results when they are available.  If  all the studies are negative for active sarcoid we can just see you back as needed for respiratory symptoms needed     09/27/2018   ov/Timothy Trudell re:  Pulmonary consultation re ? Recurrent sarcoidosis ? Pt with sister / uncle  Chief Complaint  Patient presents with  . Pulmonary Consult    Last seen for Sarcoid in 2015. She is c/o nasal congestion "for a while". She has had a rash and was seen by derm 09/16/18.    did not req otc nasal steroids for years  Then started back spring/summer 2020 and has not resumed steroids  Rash started about the same time Dyspnea:  Not limited by breathing from desired activities   Cough: none  Sleeping: nasal congestion bothers her  SABA use: none 02: none  Rash is on face and post torso, upper arms  - no pruritis    No obvious day to day or daytime variability or assoc excess/ purulent sputum or mucus plugs or hemoptysis or cp or chest tightness, subjective wheeze or overt sinus or hb symptoms.   Sleep  without nocturnal  or early am exacerbation  of respiratory  c/o's or need for noct saba. Also denies any obvious fluctuation of symptoms with weather or environmental changes or other aggravating or alleviating factors except as outlined above   No unusual exposure hx or h/o childhood pna/ asthma or knowledge of premature birth.  Current Allergies, Complete Past Medical History, Past Surgical History, Family History, and Social History were reviewed in [employer] record.  ROS  The following are not active complaints unless bolded Hoarseness, sore throat, dysphagia, dental problems, itching, sneezing,  nasal congestion or discharge of excess mucus or purulent secretions, ear ache,   fever, chills, sweats, unintended wt loss or wt gain, classically pleuritic or exertional cp,  orthopnea pnd or arm/hand swelling  or leg swelling, presyncope, palpitations, abdominal pain, anorexia, nausea, vomiting, diarrhea  or change in bowel habits or change in bladder habits, change in stools or change in urine, dysuria, hematuria,  rash, arthralgias, visual complaints, headache, numbness, weakness or ataxia or problems with walking or coordination,  change in mood or  memory.        Current Meds  Medication Sig  . triamcinolone cream (KENALOG) 0.1 % As directed            Objective:   Physical Exam    Wt 10/07/2011  126 vs 133  01/02/2012  Vs 130   04/01/2012  Vs 133 07/05/2012 vs  133 09/03/12 > 01/10/2013  139  > 08/09/2013  141 > 09/27/2018  133  amb bf nad  Vital signs reviewed - Note on arrival 02 sats  100% on RA    HEENT : pt wearing mask not removed for exam due to covid -19 concerns.  Violaceous plaque L forehead at hair line    NECK :  without JVD/Nodes/TM/ nl carotid upstrokes bilaterally   LUNGS: no acc muscle use,  Nl contour chest which is clear to A and P bilaterally without cough on insp or exp maneuvers   CV:  RRR  no s3 or murmur or increase in P2, and no edema   ABD:  soft and nontender with nl inspiratory excursion in the supine position. No bruits or organomegaly appreciated, bowel sounds nl  MS:  Nl gait/ ext warm without deformities, calf tenderness, cyanosis or clubbing No obvious joint restrictions   SKIN: warm and dry with violaceous placques over trunk posteriorly and both upper arms ext surface   NEURO:  alert, approp, nl sensorium with  no motor or cerebellar deficits apparent.          CXR PA and Lateral:   09/27/2018 :    I personally reviewed images and agree with radiology impression as follows:   1. Stable chronic changes compatible with sarcoidosis. 2. No acute abnormality.   Labs ordered/ reviewed:      Chemistry      Component Value Date/Time   NA 137 09/27/2018 1642   K 3.6 09/27/2018 1642   CL 104 09/27/2018 1642   CO2 27 09/27/2018 1642   BUN 7 09/27/2018 1642   CREATININE 0.81 09/27/2018 1642      Component Value Date/Time   CALCIUM 9.1 09/27/2018 1642   ALKPHOS 44 09/27/2018 1642   AST 21 09/27/2018 1642   ALT 10 09/27/2018 1642   BILITOT 0.6 09/27/2018 1642   alb/protein ratio             wnl                09/27/2018       Lab Results  Component Value Date   WBC 5.9 09/27/2018   HGB 12.9 09/27/2018   HCT 38.2 09/27/2018   MCV 81.3 09/27/2018   PLT 301.0 09/27/2018       EOS                                                               0.2                                     09/28/2018     Lab Results  Component Value Date   ESRSEDRATE 2 09/27/2018   ESRSEDRATE 9 06/15/2011         Labs ordered 09/27/2018  :    Angiotensin level       Assessment & Plan:

## 2018-09-28 ENCOUNTER — Encounter: Payer: Self-pay | Admitting: Internal Medicine

## 2018-09-28 LAB — CBC WITH DIFFERENTIAL/PLATELET
Basophils Absolute: 0.1 10*3/uL (ref 0.0–0.1)
Basophils Relative: 1.6 % (ref 0.0–3.0)
Eosinophils Absolute: 0.2 10*3/uL (ref 0.0–0.7)
Eosinophils Relative: 2.6 % (ref 0.0–5.0)
HCT: 38.2 % (ref 36.0–46.0)
Hemoglobin: 12.9 g/dL (ref 12.0–15.0)
Lymphocytes Relative: 22.8 % (ref 12.0–46.0)
Lymphs Abs: 1.3 10*3/uL (ref 0.7–4.0)
MCHC: 33.7 g/dL (ref 30.0–36.0)
MCV: 81.3 fl (ref 78.0–100.0)
Monocytes Absolute: 0.8 10*3/uL (ref 0.1–1.0)
Monocytes Relative: 14.3 % — ABNORMAL HIGH (ref 3.0–12.0)
Neutro Abs: 3.4 10*3/uL (ref 1.4–7.7)
Neutrophils Relative %: 58.7 % (ref 43.0–77.0)
Platelets: 301 10*3/uL (ref 150.0–400.0)
RBC: 4.7 Mil/uL (ref 3.87–5.11)
RDW: 13.3 % (ref 11.5–15.5)
WBC: 5.9 10*3/uL (ref 4.0–10.5)

## 2018-09-28 LAB — BASIC METABOLIC PANEL
BUN: 7 mg/dL (ref 6–23)
CO2: 27 mEq/L (ref 19–32)
Calcium: 9.1 mg/dL (ref 8.4–10.5)
Chloride: 104 mEq/L (ref 96–112)
Creatinine, Ser: 0.81 mg/dL (ref 0.40–1.20)
GFR: 94.68 mL/min (ref >60.00)
Glucose, Bld: 68 mg/dL — ABNORMAL LOW (ref 70–99)
Potassium: 3.6 mEq/L (ref 3.5–5.1)
Sodium: 137 mEq/L (ref 135–145)

## 2018-09-28 LAB — HEPATIC FUNCTION PANEL
ALT: 10 U/L (ref 0–35)
AST: 21 U/L (ref 0–37)
Albumin: 4 g/dL (ref 3.5–5.2)
Alkaline Phosphatase: 44 U/L (ref 39–117)
Bilirubin, Direct: 0.1 mg/dL (ref 0.0–0.3)
Total Bilirubin: 0.6 mg/dL (ref 0.2–1.2)
Total Protein: 7 g/dL (ref 6.0–8.3)

## 2018-09-28 LAB — SEDIMENTATION RATE: Sed Rate: 2 mm/hr (ref 0–20)

## 2018-09-28 NOTE — Progress Notes (Signed)
Spoke with pt and notified of results per Dr. Wert. Pt verbalized understanding and denied any questions. 

## 2018-09-28 NOTE — Assessment & Plan Note (Signed)
Clinical dx only with symptoms starting ? Nov 2012 mostly nasal     - Chronic prednisone rx 08/22/2011 >  01/11/2013     - Refer to opthamology 11/18/2011 >>> neg eval > referred back 04/01/12 for R eye swelling    - PFT's 07/29/13 >  wnl    - CXR 08/09/13  wnl     - new rash spring 2020 bx 09/16/2018     - Angiotensin 09/27/2018 pending  Classic sarcoid rash onset spring 2020 with bx pending and issue of best rx in absence of any apparent pulmonary manifestations > does have recurrent rhintis symptoms but this is non-specific and can be handled as befor with nasal steroids and ent f/u prn  rec  Consider plaquenil 200 mg daily x one month if baseline lfts ok and then titrate as high as 400 mg daily if needed   Short term prednisone ok in this setting too = 10 days rx not to exceed 20 mg per day.

## 2018-09-28 NOTE — Assessment & Plan Note (Signed)
Added nasal steroids 11/18/2011  - trial of 1st gen H1 blockers 09/04/11   Reviewed how to use nasal steroids otc with afrin for the first 5 days to help the steroids reach the target tissue.   Total time devoted to counseling  > 50 % of initial 60 min office visit:  review case with pt/ discussion of options/alternatives/ personally creating written customized instructions  in presence of pt  then going over those specific  Instructions directly with the pt including how to use all of the meds but in particular covering each new medication in detail and the difference between the maintenance= "automatic" meds and the prns using an action plan format for the latter (If this problem/symptom => do that organization reading Left to right).  Please see AVS from this visit for a full list of these instructions which I personally wrote for this pt and  are unique to this visit.

## 2018-09-29 LAB — ANGIOTENSIN CONVERTING ENZYME: Angiotensin-Converting Enzyme: 141 U/L — ABNORMAL HIGH (ref 9–67)

## 2018-11-08 ENCOUNTER — Ambulatory Visit: Payer: 59 | Admitting: Internal Medicine

## 2018-11-08 ENCOUNTER — Other Ambulatory Visit: Payer: Self-pay

## 2018-11-08 ENCOUNTER — Encounter: Payer: Self-pay | Admitting: Internal Medicine

## 2018-11-08 DIAGNOSIS — J310 Chronic rhinitis: Secondary | ICD-10-CM

## 2018-11-08 DIAGNOSIS — D869 Sarcoidosis, unspecified: Secondary | ICD-10-CM

## 2018-11-08 MED ORDER — PREDNISONE 10 MG PO TABS: ORAL_TABLET | ORAL | 0 refills | Status: DC

## 2018-11-08 NOTE — Assessment & Plan Note (Signed)
Added nasal steroids 11/18/2011  - trial of 1st gen H1 blockers 09/04/11   Reminded I emphasized that nasal steroids have no immediate benefit in terms of improving symptoms.  To help them reached the target tissue, the patient should use Afrin two puffs every 12 hours applied one min before using the nasal steroids.  Afrin should be stopped after no more than 5 days.  If the symptoms worsen, Afrin can be restarted after 5 days off of therapy to prevent rebound congestion from overuse of Afrin.  I also emphasized that in no way are nasal steroids a concern in terms of "addiction".

## 2018-11-08 NOTE — Assessment & Plan Note (Signed)
Clinical dx only with symptoms starting ? 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/2018 ([place])    - Angiotensin 09/27/2018  = 141 prior to any rx     - Plaquenil 400 mg daily x 09/30/2018   Reports much better rash while on pred and much worse off it despite max plaquenil x over 6 weeks   The goal with a chronic steroid dependent illness is always arriving at the lowest effective dose that controls the disease/symptoms and not accepting a set "formula" which is based on statistics or guidelines that don't always take into account patient  variability or the natural hx of the dz in every individual patient, which may well vary over time.  For now therefore I recommend the patient maintain  20 mg until better (ceiling) and floor of 5 mg and f/u in 6 weeks with repeat ACE level on return since may be good clinical marker of non-pulmonary organ involvement   Discussed in detail all the  indications, usual  risks and alternatives  relative to the benefits with patient who agrees to proceed with rx as outlined.      I had an extended discussion with the patient reviewing all relevant studies completed to date and  lasting 15 to 20 minutes of a 25 minute visit    Each maintenance medication was reviewed in detail including most importantly the difference between maintenance and prns and under what circumstances the prns are to be triggered using an action plan format that is not reflected in the computer generated alphabetically organized AVS.     Please see AVS for specific instructions unique to this visit that I personally wrote and verbalized to the the pt in detail and then reviewed with pt  by my nurse highlighting any  changes in therapy recommended at today's  visit to their plan of care.

## 2018-11-08 NOTE — Progress Notes (Signed)
Subjective:    Patient ID: Katelyn Stevens, female    DOB: [birth year]   MRN: [id number]    Brief patient profile:  38 yobf quit smoking 2012 then starting November 2012 onset of nasal congestion then cough and sob in winter of 2013 and then lymph node swelling spring 2013 and referred 08/21/2011 to pulmonary clinic by her brother with strong fm hx of sarcoid.   History of Present Illness   08/21/2011 1st pulmonary eval cc can't breath through nose and green nasal drainage, much better p prednisone x 2 weeks, much worse off it.  Min arthralgias, min sob better on prednisone, no occular complaints or rash. rec augmentin x 10 days one twice daily (yogurt for lunch) Nasal saline irrigation and stop sinex unless you have to one side to sleep x 5 days only Prednisone 10 mg x 2 each until better then one each am Please schedule a follow up office visit in 6 weeks, call sooner if needed with cxr. I do recommend you see an MD eye doctor for a sarcoid eye exam > did not go        11/18/2011 f/u ov/[name]  reduced to 5 mg one daily and ok for a week, then nasal congestion flared then increased to 7.5  Then 10 mg per day x 3 days and no better with afrin. rec I emphasized that nasal steroids (nasonex)have no immediate benefit    Augmentin x 10 days one twice daily (yogurt for lunch) Prednisone 20 mg daily  until better then 10 mg per day x 5 days and then taper to qod Nasonex two puffs twice daily taper to one at bedtime if better into each nostril I do recommend you see an MD eye doctor for a sarcoid eye exam- > neg     07/05/2012 f/u ov/[name] clinical dx of sarcoid on 5 mg qod  Chief Complaint  Patient presents with  . Follow-up    with cxr.  Breathing doing pretty good.  No complaints at this time.  on days she takes prednisone less aches in hands,  No sinus problems rash or ocular cos rec Prednisone  5mg  one half every other day x 4 weeks, then one half every 3 days for a month and then stop -  if condition worsens ok to resume previous dose    8/292014 f/u ov/[name]  Still on 5 mg q 3d Chief Complaint  Patient presents with  . Follow-up    Pt states that her breathing is doing well and denies any co's today.   no complaints on days misses prednisone, specifically no sob, cough, aches, ocular ccs - continues to have some nasal congestion and watery drainage at hs despite nasal steroids.  rec Prednisone 5 mg one half every 3 days x sev weeks then stop Try chlortrimeton 4 mg at bedtime as needed for nasal congestion     01/10/2013 f/u ov/[name] re: ? Steroid dep sarcoid Chief Complaint  Patient presents with  . Follow-up    Pt states doing well and denies any co's today.    no rash, sob, cough  rec Prednisone 2.5 mg every other day x 2 weeks and stop and see if any symptoms flare (cough, short of breath, nausea, aches) - call if occurs  Please see patient coordinator before you leave today  to schedule GYN evaluation/ establish regular follow up Please schedule a follow up office visit in 6 weeks, call sooner if needed with pfts and cxr  08/09/2013 f/u ov/[name] re: f/u sarcoid no rx / never got tissue dx  Chief Complaint  Patient presents with  . Follow-up    Pt states that overall she is doing well. She states that her lymph nodes under her chin feel like they may be swollen.   No ocular, artricular complaints or rash, sob. rec Please remember to go to the lab and x-ray department downstairs for your tests - we will call you with the results when they are available.  If  all the studies are negative for active sarcoid we can just see you back as needed for respiratory symptoms needed     09/27/2018   ov/[name] re:  Pulmonary consultation re ? Recurrent sarcoidosis ? Pt with sister / uncle  Chief Complaint  Patient presents with  . Pulmonary Consult    Last seen for Sarcoid in 2015. She is c/o nasal congestion "for a while". She has had a rash and was seen by derm 09/16/18.    did not req otc nasal steroids for years  Then started back spring/summer 2020 and has not resumed steroids  Rash started about the same time Dyspnea:  Not limited by breathing from desired activities   Cough: none  Sleeping: nasal congestion bothers her  SABA use: none 02: none  Rash is on face and post torso, upper arms  - no pruritis  rec Prednisone 10 mg x 2 daily x 5 days and 5 mg daily x 5 days and stop  Afrin / nasacort Aq as before    11/08/2018  f/u ov/[name] re: sarcoidosis with severe rash, on plaquenil 400 x 09/30/2018  Chief Complaint  Patient presents with  . Follow-up    Nasal congestion has not improved.   Dyspnea:  Not limited by breathing from desired activities  x 8 min Cough: no Sleeping: no resp c/o's  SABA use: none  02: none  Eyes ok per w/u in oct 2020     No obvious day to day or daytime variability or assoc excess/ purulent sputum or mucus plugs or hemoptysis or cp or chest tightness, subjective wheeze or overt sinus or hb symptoms.   Sleeping  without nocturnal  or early am exacerbation  of respiratory  c/o's or need for noct saba. Also denies any obvious fluctuation of symptoms with weather or environmental changes or other aggravating or alleviating factors except as outlined above   No unusual exposure hx or h/o childhood pna/ asthma or knowledge of premature birth.  Current Allergies, Complete Past Medical History, Past Surgical History, Family History, and Social History were reviewed in [employer] record.  ROS  The following are not active complaints unless bolded Hoarseness, sore throat, dysphagia, dental problems, itching, sneezing,  nasal congestion or discharge of excess mucus or purulent secretions, ear ache,   fever, chills, sweats, unintended wt loss or wt gain, classically pleuritic or exertional cp,  orthopnea pnd or arm/hand swelling  or leg swelling, presyncope, palpitations, abdominal pain, anorexia, nausea,  vomiting, diarrhea  or change in bowel habits or change in bladder habits, change in stools or change in urine, dysuria, hematuria,  rash, arthralgias, visual complaints, headache, numbness, weakness or ataxia or problems with walking or coordination,  change in mood or  memory.        Current Meds  Medication Sig  . hydroxychloroquine (PLAQUENIL) 200 MG tablet Take 400 mg by mouth daily.  Marland Kitchen triamcinolone cream (KENALOG) 0.1 % As directed  Objective:   Physical Exam  Pleasant amb bf nad   11/08/2018   131  Wt 10/07/2011  126 vs 133  01/02/2012  Vs 130  04/01/2012  Vs 133 07/05/2012 vs  133 09/03/12 > 01/10/2013  139  > 08/09/2013  141 > 09/27/2018  133   BP 102/64 (BP Location: Left Arm, Cuff Size: Normal)   Pulse 61   Temp (!) 97.3 F (36.3 C) (Temporal)   Ht 5' 2.5" (1.588 m)   Wt 131 lb (59.4 kg)   SpO2 97% Comment: on RA  BMI 23.58 kg/m      HEENT : pt wearing mask not removed for exam due to covid -19 concerns.    NECK :  without JVD/Nodes/TM/ nl carotid upstrokes bilaterally   LUNGS: no acc muscle use,  Nl contour chest which is clear to A and P bilaterally without cough on insp or exp maneuvers   CV:  RRR  no s3 or murmur or increase in P2, and no edema   ABD:  soft and nontender with nl inspiratory excursion in the supine position. No bruits or organomegaly appreciated, bowel sounds nl  MS:  Nl gait/ ext warm without deformities, calf tenderness, cyanosis or clubbing No obvious joint restrictions   SKIN: warm and dry with violaceous plaques esp over trunk   NEURO:  alert, approp, nl sensorium with  no motor or cerebellar deficits apparent.                Assessment & Plan:

## 2018-11-08 NOTE — Patient Instructions (Signed)
Prednsione 10 mg x 2 until better, then 1 daily x 2 days, then one half daily   Please schedule a follow up office visit in 6 weeks, call sooner if needed

## 2018-12-20 ENCOUNTER — Encounter: Payer: Self-pay | Admitting: Internal Medicine

## 2018-12-20 ENCOUNTER — Ambulatory Visit: Payer: 59 | Admitting: Internal Medicine

## 2018-12-20 ENCOUNTER — Other Ambulatory Visit: Payer: Self-pay

## 2018-12-20 DIAGNOSIS — D869 Sarcoidosis, unspecified: Secondary | ICD-10-CM

## 2018-12-20 NOTE — Patient Instructions (Addendum)
Prednisone 10 mg one half even days x 2 weeks then stop - if worse resume previous dose    Plaquenil to be dosed by dermatology so pulmonary follow up is as needed

## 2018-12-20 NOTE — Progress Notes (Signed)
Subjective:   Patient ID: Katelyn Stevens, female    DOB: May 21, 1980   MRN: [id number]    Brief patient profile:  38 yobf quit smoking 2012 then starting November 2012 onset of nasal congestion then cough and sob in winter of 2013 and then lymph node swelling spring 2013 and referred 08/21/2011 to pulmonary clinic by her brother with strong fm hx of sarcoid.   History of Present Illness   08/21/2011 1st pulmonary eval cc can't breath through nose and green nasal drainage, much better p prednisone x 2 weeks, much worse off it.  Min arthralgias, min sob better on prednisone, no occular complaints or rash. rec augmentin x 10 days one twice daily (yogurt for lunch) Nasal saline irrigation and stop sinex unless you have to one side to sleep x 5 days only Prednisone 10 mg x 2 each until better then one each am Please schedule a follow up office visit in 6 weeks, call sooner if needed with cxr. I do recommend you see an MD eye doctor for a sarcoid eye exam > did not go        11/18/2011 f/u ov/Sully Manzi  reduced to 5 mg one daily and ok for a week, then nasal congestion flared then increased to 7.5  Then 10 mg per day x 3 days and no better with afrin. rec I emphasized that nasal steroids (nasonex)have no immediate benefit    Augmentin x 10 days one twice daily (yogurt for lunch) Prednisone 20 mg daily  until better then 10 mg per day x 5 days and then taper to qod Nasonex two puffs twice daily taper to one at bedtime if better into each nostril I do recommend you see an MD eye doctor for a sarcoid eye exam- > neg     07/05/2012 f/u ov/[name] clinical dx of sarcoid on 5 mg qod  Chief Complaint  Patient presents with  . Follow-up    with cxr.  Breathing doing pretty good.  No complaints at this time.  on days she takes prednisone less aches in hands,  No sinus problems rash or ocular cos rec Prednisone  5mg  one half every other day x 4 weeks, then one half every 3 days for a month and then stop -  if condition worsens ok to resume previous dose    8/292014 f/u ov/[name]  Still on 5 mg q 3d Chief Complaint  Patient presents with  . Follow-up    Pt states that her breathing is doing well and denies any co's today.   no complaints on days misses prednisone, specifically no sob, cough, aches, ocular ccs - continues to have some nasal congestion and watery drainage at hs despite nasal steroids.  rec Prednisone 5 mg one half every 3 days x sev weeks then stop Try chlortrimeton 4 mg at bedtime as needed for nasal congestion     01/10/2013 f/u ov/[name] re: ? Steroid dep sarcoid Chief Complaint  Patient presents with  . Follow-up    Pt states doing well and denies any co's today.    no rash, sob, cough  rec Prednisone 2.5 mg every other day x 2 weeks and stop and see if any symptoms flare (cough, short of breath, nausea, aches) - call if occurs  Please see patient coordinator before you leave today  to schedule GYN evaluation/ establish regular follow up Please schedule a follow up office visit in 6 weeks, call sooner if needed with pfts and cxr  08/09/2013 f/u ov/[name] re: f/u sarcoid no rx / never got tissue dx  Chief Complaint  Patient presents with  . Follow-up    Pt states that overall she is doing well. She states that her lymph nodes under her chin feel like they may be swollen.   No ocular, artricular complaints or rash, sob. rec Please remember to go to the lab and x-ray department downstairs for your tests - we will call you with the results when they are available.  If  all the studies are negative for active sarcoid we can just see you back as needed for respiratory symptoms needed     09/27/2018   ov/[name] re:  Pulmonary consultation re ? Recurrent sarcoidosis ? Pt with sister / uncle  Chief Complaint  Patient presents with  . Pulmonary Consult    Last seen for Sarcoid in 2015. She is c/o nasal congestion "for a while". She has had a rash and was seen by derm 09/16/18.    did not req otc nasal steroids for years  Then started back spring/summer 2020 and has not resumed steroids  Rash started about the same time Dyspnea:  Not limited by breathing from desired activities   Cough: none  Sleeping: nasal congestion bothers her  SABA use: none 02: none  Rash is on face and post torso, upper arms  - no pruritis  rec Prednisone 10 mg x 2 daily x 5 days and 5 mg daily x 5 days and stop  Afrin / nasacort Aq as before    11/08/2018  f/u ov/[name] re: sarcoidosis with severe rash, on plaquenil 400 x 09/30/2018  Chief Complaint  Patient presents with  . Follow-up    Nasal congestion has not improved.   Dyspnea:  Not limited by breathing from desired activities  x 8 min Cough: no Sleeping: no resp c/o's  SABA use: none  02: none  Eyes ok per w/u in oct 2020   rec Prednsione 10 mg x 2 until better, then 1 daily x 2 days, then one half daily       12/20/2018  f/u ov/[name] re: sarcoid mostly skin and some rhinitis and 100% better / weaned pred to 5 mg daily on plaqueninl 400 mg daily per derm  Chief Complaint  Patient presents with  . Follow-up    Pt states she is doing better since last visit and denies any complaints.  Dyspnea:  8 min mile jogging s sob   Cough: no/ nasal congestion also better  Sleeping: no resp symptoms  SABA use: none 02: none    No obvious day to day or daytime variability or assoc excess/ purulent sputum or mucus plugs or hemoptysis or cp or chest tightness, subjective wheeze or overt sinus or hb symptoms.   Sleeping  without nocturnal  or early am exacerbation  of respiratory  c/o's or need for noct saba. Also denies any obvious fluctuation of symptoms with weather or environmental changes or other aggravating or alleviating factors except as outlined above   No unusual exposure hx or h/o childhood pna/ asthma or knowledge of premature birth.  Current Allergies, Complete Past Medical History, Past Surgical History, Family  History, and Social History were reviewed in [employer] record.  ROS  The following are not active complaints unless bolded Hoarseness, sore throat, dysphagia, dental problems, itching, sneezing,  nasal congestion or discharge of excess mucus or purulent secretions, ear ache,   fever, chills, sweats, unintended wt  loss or wt gain, classically pleuritic or exertional cp,  orthopnea pnd or arm/hand swelling  or leg swelling, presyncope, palpitations, abdominal pain, anorexia, nausea, vomiting, diarrhea  or change in bowel habits or change in bladder habits, change in stools or change in urine, dysuria, hematuria,  rash, arthralgias, visual complaints, headache, numbness, weakness or ataxia or problems with walking or coordination,  change in mood or  memory.        Current Meds  Medication Sig  . hydroxychloroquine (PLAQUENIL) 200 MG tablet Take 400 mg by mouth daily.  . predniSONE (DELTASONE) 10 MG tablet  1/2  daily               Objective:   Physical Exam  Pleasant amb bf nad   12/20/2018  130  11/08/2018    131  Wt 10/07/2011  126 vs 133  01/02/2012    Vital signs reviewed - Note on arrival 02 sats  100% on RA     HEENT : pt wearing mask not removed for exam due to covid -19 concerns.    NECK :  without JVD/Nodes/TM/ nl carotid upstrokes bilaterally   LUNGS: no acc muscle use,  Nl contour chest which is clear to A and P bilaterally without cough on insp or exp maneuvers   CV:  RRR  no s3 or murmur or increase in P2, and no edema   ABD:  soft and nontender with nl inspiratory excursion in the supine position. No bruits or organomegaly appreciated, bowel sounds nl  MS:  Nl gait/ ext warm without deformities, calf tenderness, cyanosis or clubbing No obvious joint restrictions   SKIN: warm and dry without lesions    NEURO:  alert, approp, nl sensorium with  no motor or cerebellar deficits apparent.                       Assessment & Plan:

## 2018-12-21 ENCOUNTER — Encounter: Payer: Self-pay | Admitting: Internal Medicine

## 2018-12-21 NOTE — Assessment & Plan Note (Signed)
Clinical dx only with symptoms starting ? 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/2018 ([place]) : [place]     - Angiotensin 09/27/2018  = 141 prior to any rx     - Plaquenil 400 mg daily x 09/30/2018     - taper off prednisone by end of Dec 2020  No evidence of any active dz at this point suggesting a delayed but very robust response to plaquenil.  The goal with a chronic steroid dependent illness is always arriving at the lowest effective dose that controls the disease/symptoms and not accepting a set "formula" which is based on statistics or guidelines that don't always take into account patient  variability or the natural hx of the dz in every individual patient, which may well vary over time.  For now therefore I recommend the patient taper off pred by end of year, restart at lowest effective dose if flares but not more than 20 mg daily.   Discussed in detail all the  indications, usual  risks and alternatives  relative to the benefits with patient who agrees to proceed with Rx as outlined.      I had an extended discussion with the patient reviewing all relevant studies completed to date and  lasting 15 to 20 minutes of a 25 minute visit    I performed detailed device teaching using a teach back method which extended face to face time for this visit (see above)  Each maintenance medication was reviewed in detail including emphasizing most importantly the difference between maintenance and prns and under what circumstances the prns are to be triggered using an action plan format that is not reflected in the computer generated alphabetically organized AVS which I have not found useful in most complex patients, especially with respiratory illnesses  Please see  AVS for specific instructions unique to this visit that I personally wrote and verbalized to the the pt in detail and then reviewed with pt  by my nurse highlighting any  changes in therapy recommended at today's visit to their plan of care.

## 2019-02-18 ENCOUNTER — Telehealth: Payer: Self-pay | Admitting: Internal Medicine

## 2019-02-18 NOTE — Telephone Encounter (Signed)
Instructions from last OV 12/20/18  Prednisone 10 mg one half even days x 2 weeks then stop - if worse resume previous dose    Plaquenil to be dosed by dermatology so pulmonary follow up is as needed      Called and spoke with pt. Pt stated she is needing to have her prednisone refilled. Last OV, MW said for pt to f/u prn. Pt does not have a f/u scheduled. Dr. Sherene Sires, please advise if you are okay with [place] refilling pt's prednisone and if you want [place] to fill it exactly stated above from last OV and also please advise in regards to f/u appt for pt.

## 2019-02-19 NOTE — Telephone Encounter (Signed)
If she's just taking it for skin issues, it should be refilled by derm  If she's taking it for any other reason, ok to refill x one but ov before this runs out

## 2019-02-21 MED ORDER — PREDNISONE 10 MG PO TABS: ORAL_TABLET | ORAL | 0 refills | Status: DC

## 2019-02-21 NOTE — Telephone Encounter (Signed)
Attempted to call pt but unable to reach. Attempted to call pt but unable to reach.  Left message for pt to return call.

## 2019-02-21 NOTE — Telephone Encounter (Signed)
Spoke with the pt  She states that she is taking pred for her skin as well as her lymph nodes  I have refilled med and scheduled her an appt with Dr Sherene Sires to discuss further

## 2019-03-11 ENCOUNTER — Encounter: Payer: Self-pay | Admitting: Internal Medicine

## 2019-03-11 ENCOUNTER — Other Ambulatory Visit: Payer: Self-pay

## 2019-03-11 ENCOUNTER — Ambulatory Visit: Payer: 59 | Admitting: Internal Medicine

## 2019-03-11 DIAGNOSIS — J310 Chronic rhinitis: Secondary | ICD-10-CM

## 2019-03-11 DIAGNOSIS — D869 Sarcoidosis, unspecified: Secondary | ICD-10-CM

## 2019-03-11 LAB — HEPATIC FUNCTION PANEL
ALT: 10 U/L (ref 0–35)
AST: 15 U/L (ref 0–37)
Albumin: 3.6 g/dL (ref 3.5–5.2)
Alkaline Phosphatase: 32 U/L — ABNORMAL LOW (ref 39–117)
Bilirubin, Direct: 0.1 mg/dL (ref 0.0–0.3)
Total Bilirubin: 0.4 mg/dL (ref 0.2–1.2)
Total Protein: 6.2 g/dL (ref 6.0–8.3)

## 2019-03-11 LAB — SEDIMENTATION RATE: Sed Rate: 1 mm/hr (ref 0–20)

## 2019-03-11 NOTE — Patient Instructions (Addendum)
Ceiling for prednisone is 10 mg x 2 each am and floor is one half every other day   Please remember to go to the lab department   for your tests - we will call you with the results when they are available.      Please schedule a follow up visit in 6 months but call sooner if needed

## 2019-03-11 NOTE — Progress Notes (Signed)
Subjective:   Patient ID: Katelyn Stevens, female    DOB: 03/08/1980   MRN: [id number]    Brief patient profile:  38 yobf quit smoking 2012 then starting November 2012 onset of nasal congestion then cough and sob in winter of 2013 and then lymph node swelling spring 2013 and referred 08/21/2011 to pulmonary clinic by her brother with strong fm hx of sarcoid with skin bx c/w sarcoid 09/2018    History of Present Illness   08/21/2011 1st pulmonary eval cc can't breath through nose and green nasal drainage, much better p prednisone x 2 weeks, much worse off it.  Min arthralgias, min sob better on prednisone, no occular complaints or rash. rec augmentin x 10 days one twice daily (yogurt for lunch) Nasal saline irrigation and stop sinex unless you have to one side to sleep x 5 days only Prednisone 10 mg x 2 each until better then one each am Please schedule a follow up office visit in 6 weeks, call sooner if needed with cxr. I do recommend you see an MD eye doctor for a sarcoid eye exam > did not go    11/18/2011 f/u ov/[name]  reduced to 5 mg one daily and ok for a week, then nasal congestion flared then increased to 7.5  Then 10 mg per day x 3 days and no better with afrin. rec I emphasized that nasal steroids (nasonex)have no immediate benefit    Augmentin x 10 days one twice daily (yogurt for lunch) Prednisone 20 mg daily  until better then 10 mg per day x 5 days and then taper to qod Nasonex two puffs twice daily taper to one at bedtime if better into each nostril I do recommend you see an MD eye doctor for a sarcoid eye exam- > neg     09/27/2018   ov/[name] re:  Pulmonary consultation re ? Recurrent sarcoidosis ? Pt with sister / uncle  Chief Complaint  Patient presents with  . Pulmonary Consult    Last seen for Sarcoid in 2015. She is c/o nasal congestion "for a while". She has had a rash and was seen by derm 09/16/18.   did not req otc nasal steroids for years  Then started back  spring/summer 2020 and has not resumed steroids  Rash started about the same time Dyspnea:  Not limited by breathing from desired activities   Cough: none  Sleeping: nasal congestion bothers her  SABA use: none 02: none  Rash is on face and post torso, upper arms  - no pruritis  rec Prednisone 10 mg x 2 daily x 5 days and 5 mg daily x 5 days and stop  Afrin / nasacort Aq as before    11/08/2018  f/u ov/[name] re: sarcoidosis with severe rash, on plaquenil 400 x 09/30/2018  Chief Complaint  Patient presents with  . Follow-up    Nasal congestion has not improved.   Dyspnea:  Not limited by breathing from desired activities  x 8 min Cough: no Sleeping: no resp c/o's  SABA use: none  02: none  Eyes ok per w/u in oct 2020   rec Prednsione 10 mg x 2 until better, then 1 daily x 2 days, then one half daily       12/20/2018  f/u ov/[name] re: sarcoid mostly skin and some rhinitis and 100% better / weaned pred to 5 mg daily on plaqueninl 400 mg daily per derm  Chief Complaint  Patient presents with  .  Follow-up    Pt states she is doing better since last visit and denies any complaints.  Dyspnea:  8 min mile jogging s sob   Cough: no/ nasal congestion also better  Sleeping: no resp symptoms  SABA use: none 02: none  rec Prednisone 10 mg one half even days x 2 weeks then stop - if worse resume previous dose  Plaquenil to be dosed by dermatology so pulmonary follow up is as needed    03/11/2019  f/u ov/[name] re: ok for a month then back to 10 mg one half  Chief Complaint  Patient presents with  . Follow-up    Sarcoidois   Dyspnea:  No sob even with aerobics  Cough: no but nasal symptoms also worse off prednisone  Sleeping: no resp cc SABA use none 02: none    No obvious day to day or daytime variability or assoc excess/ purulent sputum or mucus plugs or hemoptysis or cp or chest tightness, subjective wheeze or overt sinus or hb symptoms.   Seeping  without nocturnal  or  early am exacerbation  of respiratory  c/o's or need for noct saba. Also denies any obvious fluctuation of symptoms with weather or environmental changes or other aggravating or alleviating factors except as outlined above   No unusual exposure hx or h/o childhood pna/ asthma or knowledge of premature birth.  Current Allergies, Complete Past Medical History, Past Surgical History, Family History, and Social History were reviewed in [employer] record.  ROS  The following are not active complaints unless bolded Hoarseness, sore throat, dysphagia, dental problems, itching, sneezing,  nasal congestion or discharge of excess mucus or purulent secretions, ear ache,   fever, chills, sweats, unintended wt loss or wt gain, classically pleuritic or exertional cp,  orthopnea pnd or arm/hand swelling  or leg swelling, presyncope, palpitations, abdominal pain, anorexia, nausea, vomiting, diarrhea  or change in bowel habits or change in bladder habits, change in stools or change in urine, dysuria, hematuria,  rash, arthralgias, visual complaints, headache, numbness, weakness or ataxia or problems with walking or coordination,  change in mood or  memory.        Current Meds  Medication Sig  . hydroxychloroquine (PLAQUENIL) 200 MG tablet Take 400 mg by mouth daily.  . predniSONE (DELTASONE) 10 MG tablet 2 daily with bfast until better, then 1 daily x 2 weeks, then 1/2  daily  . triamcinolone cream (KENALOG) 0.1 % As directed                  Objective:   Physical Exam    03/11/2019      133 12/20/2018  130  11/08/2018    131  Wt 10/07/2011  126 vs 133  01/02/2012   Pleasant am bf nad  Vital signs reviewed  03/11/2019  - Note at rest 02 sats  100% on RA     HEENT : pt wearing mask not removed for exam due to covid -19 concerns.    NECK :  without JVD/Nodes/TM/ nl carotid upstrokes bilaterally   LUNGS: no acc muscle use,  Nl contour chest which is clear to A and P bilaterally  without cough on insp or exp maneuvers   CV:  RRR  no s3 or murmur or increase in P2, and no edema   ABD:  soft and nontender with nl inspiratory excursion in the supine position. No bruits or organomegaly appreciated, bowel sounds nl  MS:  Nl gait/ ext  warm without deformities, calf tenderness, cyanosis or clubbing No obvious joint restrictions   SKIN: warm and dry with multiple macular purplish lesions over back, none raised or nodular, no Erythema nodosum   NEURO:  alert, approp, nl sensorium with  no motor or cerebellar deficits apparent.        Lab Results  Component Value Date   ALT 10 03/11/2019   AST 15 03/11/2019   ALKPHOS 32 (L) 03/11/2019   BILITOT 0.4 03/11/2019       Lab Results  Component Value Date   ESRSEDRATE 1 03/11/2019   ESRSEDRATE 2 09/27/2018   ESRSEDRATE 9 06/15/2011     Labs ordered 03/11/2019  :  [facility]                  Assessment & Plan:

## 2019-03-13 ENCOUNTER — Encounter: Payer: Self-pay | Admitting: Internal Medicine

## 2019-03-13 LAB — ANGIOTENSIN CONVERTING ENZYME: Angiotensin-Converting Enzyme: 40 U/L (ref 9–67)

## 2019-03-13 NOTE — Assessment & Plan Note (Signed)
Added nasal steroids 11/18/2011  - trial of 1st gen H1 blockers 09/04/11    Flared off prednisone and suspect this is also related to sarcoid   I emphasized that nasal steroids have no immediate benefit in terms of improving symptoms.  To help them reached the target tissue, the patient should use Afrin two puffs every 12 hours applied one min before using the nasal steroids.  Afrin should be stopped after no more than 5 days.  If the symptoms worsen, Afrin can be restarted after 5 days off of therapy to prevent rebound congestion from overuse of Afrin.  I also emphasized that in no way are nasal steroids a concern in terms of "addiction".    If not better then ENT eval needed           Each maintenance medication was reviewed in detail including emphasizing most importantly the difference between maintenance and prns and under what circumstances the prns are to be triggered using an action plan format where appropriate.  Total time for H and P, chart review, counseling, teaching device(application of nasal steroids)  and generating customized AVS unique to this office visit / charting = 30 min

## 2019-03-13 NOTE — Assessment & Plan Note (Signed)
Clinical dx only with symptoms starting ? 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/2018 ([place]) : [place]     - Angiotensin 09/27/2018  = 141 prior to any rx     - Plaquenil 400 mg daily x 09/30/2018     - taper off prednisone by end of Dec 2020 > skin flared w/in a month as did nasal symptoms   Tolerating plaquenil for for now but not yet able to taper off prednisone completely even on 400 mg per day   The goal with a chronic steroid dependent illness is always arriving at the lowest effective dose that controls the disease/symptoms and not accepting a set "formula" which is based on statistics or guidelines that don't always take into account patient  variability or the natural hx of the dz in every individual patient, which may well vary over time.  For now therefore I recommend the patient maintain  20 mg ceiling and 5 mg floor

## 2019-03-14 ENCOUNTER — Telehealth: Payer: Self-pay | Admitting: Internal Medicine

## 2019-03-14 NOTE — Progress Notes (Signed)
LMTCB

## 2019-03-14 NOTE — Telephone Encounter (Signed)
Nyoka Cowden, MD  03/14/2019 8:29 AM EST    Call patient : Study is much improved vs prior in fact back to nl levels so no no adjustments needed from my perspective beyond recs already made at ov   Attempted to call pt but unable to reach. Left message for pt to return call.

## 2019-03-17 NOTE — Telephone Encounter (Signed)
Spoke with pt. She has already been made aware of results. Nothing further was needed.

## 2019-06-01 ENCOUNTER — Telehealth: Payer: Self-pay | Admitting: Internal Medicine

## 2019-06-01 NOTE — Telephone Encounter (Signed)
Called and spoke with patient to confirm fax number and to ask where paperwork was going. It was going to [facility] Dr. Sharyn Lull at [phone]. Labs have been faxed to number above. Nothing further needed at this time.

## 2019-08-31 ENCOUNTER — Telehealth: Payer: Self-pay | Admitting: Internal Medicine

## 2019-08-31 NOTE — Telephone Encounter (Signed)
Primary Pulmonologist:Wert Last office visit and with whom: 03/11/2019 Wert What do we see them for (pulmonary problems): Sarcoidosis, Chronic Rhinitis Last OV assessment/plan:  Assessment & Plan:      Assessment & Plan Note by Nyoka Cowden, MD at 03/13/2019 5:55 AM Author: Nyoka Cowden, MD Author Type: Physician Filed: 03/13/2019 5:56 AM  Note Status: Written Cosign: Cosign Not Required Encounter Date: 03/11/2019  Problem: Chronic rhinitis  Editor: Nyoka Cowden, MD (Physician)                  Added nasal steroids 11/18/2011  - trial of 1st gen H1 blockers 09/04/11    Flared off prednisone and suspect this is also related to sarcoid   I emphasized that nasal steroids have no immediate benefit in terms of improving symptoms.  To help them reached the target tissue, the patient should use Afrin two puffs every 12 hours applied one min before using the nasal steroids.  Afrin should be stopped after no more than 5 days.  If the symptoms worsen, Afrin can be restarted after 5 days off of therapy to prevent rebound congestion from overuse of Afrin.  I also emphasized that in no way are nasal steroids a concern in terms of "addiction".    If not better then ENT eval needed           Each maintenance medication was reviewed in detail including emphasizing most importantly the difference between maintenance and prns and under what circumstances the prns are to be triggered using an action plan format where appropriate.  Total time for H and P, chart review, counseling, teaching device(application of nasal steroids)  and generating customized AVS unique to this office visit / charting = 30 min         Assessment & Plan Note by Nyoka Cowden, MD at 03/13/2019 5:54 AM Author: Nyoka Cowden, MD Author Type: Physician Filed: 03/13/2019 5:55 AM  Note Status: Written Cosign: Cosign Not Required Encounter Date: 03/11/2019  Problem: Sarcoidosis  Editor: Nyoka Cowden, MD  (Physician)                  Clinical dx only with symptoms starting ? 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/2018 ([place]) : [place]     - Angiotensin 09/27/2018  = 141 prior to any rx     - Plaquenil 400 mg daily x 09/30/2018     - taper off prednisone by end of Dec 2020 > skin flared w/in a month as did nasal symptoms   Tolerating plaquenil for for now but not yet able to taper off prednisone completely even on 400 mg per day   The goal with a chronic steroid dependent illness is always arriving at the lowest effective dose that controls the disease/symptoms and not accepting a set "formula" which is based on statistics or guidelines that don't always take into account patient  variability or the natural hx of the dz in every individual patient, which may well vary over time.  For now therefore I recommend the patient maintain  20 mg ceiling and 5  mg floor     Patient Instructions by Nyoka Cowden, MD at 03/11/2019 11:30 AM Author: Nyoka Cowden, MD Author Type: Physician Filed: 03/11/2019 11:56 AM  Note Status: Addendum Cosign: Cosign Not Required Encounter Date: 03/11/2019  Editor: Nyoka Cowden, MD (Physician)      Prior Versions: 1. Nyoka Cowden, MD (Physician) at 03/11/2019 11:54 AM - Signed      Ceiling for prednisone is 10 mg x 2 each am and floor is one half every other day   Please remember to go to the lab department   for your tests - we will call you with the results when they are available.      Please schedule a follow up visit in 6 months but call sooner if needed     Instructions  Ceiling for prednisone is 10 mg x 2 each am and floor is one half every other day   Please remember to go to the lab department   for your tests -  we will call you with the results when they are available.           Was appointment offered to patient (explain)? Scheduled her an appointment for 8/30 with Tammy at 4:30   Reason for call: Nasal congestion causing her to mouth breathe, skin flare up with red ares to the skin.  She states the areas are not raised and are not itchy.  She is using Triamcinlone cream.  She has been using her saline rinse and her nasal sprays as instructed.  No fever.  No respiratory s/s.  She want to have lab work and have an exam prior to 8/31 as her insurance will laps at that time.  (examples of things to ask: : When did symptoms start? Fever? Cough? Productive? Color to sputum? More sputum than usual? Wheezing? Have you needed increased oxygen? Are you taking your respiratory medications? What over the counter measures have you tried?  Allergies  Allergen Reactions   Aspirin Other (See Comments)    Facial swelling    There is no immunization history for the selected administration types on file for this patient.

## 2019-08-31 NOTE — Telephone Encounter (Signed)
Called and spoke with pt letting her know the info stated by MW and she verbalized understanding. Nothing further needed. 

## 2019-08-31 NOTE — Telephone Encounter (Signed)
Would not try to treat this over the phone or order labs ahead of eval by Tammy already scheduled for 09/05/19

## 2019-09-05 ENCOUNTER — Ambulatory Visit: Payer: 59 | Admitting: Adult Health

## 2019-09-05 ENCOUNTER — Other Ambulatory Visit: Payer: Self-pay

## 2019-09-05 ENCOUNTER — Ambulatory Visit (INDEPENDENT_AMBULATORY_CARE_PROVIDER_SITE_OTHER): Payer: 59

## 2019-09-05 ENCOUNTER — Encounter: Payer: Self-pay | Admitting: Adult Health

## 2019-09-05 VITALS — BP 86/50 | HR 72 | Temp 98.7°F | Ht 62.5 in | Wt 134.4 lb

## 2019-09-05 DIAGNOSIS — D869 Sarcoidosis, unspecified: Secondary | ICD-10-CM | POA: Diagnosis not present

## 2019-09-05 DIAGNOSIS — J310 Chronic rhinitis: Secondary | ICD-10-CM | POA: Diagnosis not present

## 2019-09-05 LAB — BASIC METABOLIC PANEL
BUN: 7 mg/dL (ref 6–23)
CO2: 26 mEq/L (ref 19–32)
Calcium: 9 mg/dL (ref 8.4–10.5)
Chloride: 105 mEq/L (ref 96–112)
Creatinine, Ser: 0.81 mg/dL (ref 0.40–1.20)
GFR: 94.24 mL/min (ref >60.00)
Glucose, Bld: 83 mg/dL (ref 70–99)
Potassium: 4.3 mEq/L (ref 3.5–5.1)
Sodium: 136 mEq/L (ref 135–145)

## 2019-09-05 LAB — CBC WITH DIFFERENTIAL/PLATELET
Basophils Absolute: 0.1 10*3/uL (ref 0.0–0.1)
Basophils Relative: 1 % (ref 0.0–3.0)
Eosinophils Absolute: 0.1 10*3/uL (ref 0.0–0.7)
Eosinophils Relative: 1.8 % (ref 0.0–5.0)
HCT: 37.6 % (ref 36.0–46.0)
Hemoglobin: 12.8 g/dL (ref 12.0–15.0)
Lymphocytes Relative: 19.1 % (ref 12.0–46.0)
Lymphs Abs: 1.3 10*3/uL (ref 0.7–4.0)
MCHC: 34.1 g/dL (ref 30.0–36.0)
MCV: 81.4 fl (ref 78.0–100.0)
Monocytes Absolute: 0.7 10*3/uL (ref 0.1–1.0)
Monocytes Relative: 11.1 % (ref 3.0–12.0)
Neutro Abs: 4.4 10*3/uL (ref 1.4–7.7)
Neutrophils Relative %: 67 % (ref 43.0–77.0)
Platelets: 288 10*3/uL (ref 150.0–400.0)
RBC: 4.62 Mil/uL (ref 3.87–5.11)
RDW: 13.5 % (ref 11.5–15.5)
WBC: 6.6 10*3/uL (ref 4.0–10.5)

## 2019-09-05 MED ORDER — PREDNISONE 10 MG PO TABS: 10.0000 mg | ORAL_TABLET | Freq: Every day | ORAL | 5 refills | Status: DC

## 2019-09-05 MED ORDER — PREDNISONE 10 MG PO TABS: 10.0000 mg | ORAL_TABLET | Freq: Every day | ORAL | 0 refills | Status: DC

## 2019-09-05 MED ORDER — PREDNISONE 10 MG PO TABS: ORAL_TABLET | ORAL | 3 refills | Status: DC

## 2019-09-05 NOTE — Assessment & Plan Note (Signed)
Mild flare off of prednisone.  Will restart and taper back down to 5 mg every other day.  Continue on Plaquenil.  Labs today.  Plan  Patient Instructions  Continue on Plaquenil.  Chest and labs today .  Restart Prednisone 20mg  daily for 1 week then 10mg  daily for 1 week then 5mg  daily for 1 week and 5mg  every other day . Hold at this dose  Follow up with Dr.  In 3 months and As needed   Follow up with Dermatology tomorrow.  Please contact office for sooner follow up if symptoms do not improve or worsen or seek emergency care

## 2019-09-05 NOTE — Patient Instructions (Addendum)
Continue on Plaquenil.  Chest and labs today .  Restart Prednisone 20mg  daily for 1 week then 10mg  daily for 1 week then 5mg  daily for 1 week and 5mg  every other day . Hold at this dose  Follow up with Dr.  In 3 months and As needed   Follow up with Dermatology tomorrow.  Please contact office for sooner follow up if symptoms do not improve or worsen or seek emergency care

## 2019-09-05 NOTE — Progress Notes (Signed)
@Patient  ID: , female    DOB: [birth year], 39 y.o.   MRN: [id number]  Chief Complaint  Patient presents with  . Follow-up    Sarcoid     Referring provider: No ref. provider found  HPI: 39 year old African American female former smoker followed for sarcoidosis with pulmonary and cutaneous involvement on Plaquenil and prednisone  TEST/EVENTS :   Chronic prednisone rx 08/22/2011 >  01/11/2013     - Refer to opthamology 11/18/2011 >>> neg eval > referred back 04/01/12 for R eye swelling > 10/26/18 no sarcoid     - PFT's 07/29/13 >  wnl    - CXR 08/09/13  wnl     - new rash spring 2020>>>   bx 09/16/2018 ([place]) : [place]     - Angiotensin 09/27/2018  = 141 prior to any rx     - Plaquenil 400 mg daily x 09/30/2018     - taper off prednisone by end of Dec 2020 > skin flared w/in a month as did nasal symptoms   09/05/2019 Follow up : Sarcoid  Patient presents for a 41-month follow-up.  Patient has underlying known sarcoidosis with pulmonary and cutaneous involvement.  She says she has been doing well but up about 2 weeks ago she started nose her breathing was not as good and she was getting a stuffy nose.  She is also had a flare in her skin lesions.  She did run out of prednisone 2 weeks ago.  Patient is on Plaquenil.  And was on prednisone 5 mg every other day.  Patient needs a refill of her prednisone.  Patient denies any chest pain orthopnea PND joint swelling.    Allergies  Allergen Reactions  . Aspirin Other (See Comments)    Facial swelling    There is no immunization history for the selected administration types on file for this patient.  Past Medical History:  Diagnosis Date  . Sarcoidosis     Tobacco History: Social History   Tobacco Use  Smoking Status Former Smoker  . Packs/day: 0.50  . Years: 6.00  . Pack years: 3.00  . Types: Cigarettes  . Quit date: 08/07/2010  . Years since quitting: 9.0  Smokeless Tobacco Never Used   Counseling given: Not  Answered   Outpatient Medications Prior to Visit  Medication Sig Dispense Refill  . hydroxychloroquine (PLAQUENIL) 200 MG tablet Take 400 mg by mouth daily.    10/07/2010 triamcinolone cream (KENALOG) 0.1 % As directed    . predniSONE (DELTASONE) 10 MG tablet 2 daily with bfast until better, then 1 daily x 2 weeks, then 1/2  daily 100 tablet 0   No facility-administered medications prior to visit.     Review of Systems:   Constitutional:   No  weight loss, night sweats,  Fevers, chills, fatigue, or  lassitude.  HEENT:   No headaches,  Difficulty swallowing,  Tooth/dental problems, or  Sore throat,                No sneezing, itching, ear ache,  +nasal congestion, post nasal drip,   CV:  No chest pain,  Orthopnea, PND, swelling in lower extremities, anasarca, dizziness, palpitations, syncope.   GI  No heartburn, indigestion, abdominal pain, nausea, vomiting, diarrhea, change in bowel habits, loss of appetite, bloody stools.   Resp:   No chest wall deformity  Skin: no rash or lesions.  GU: no dysuria, change in color of urine, no urgency or frequency.  No  flank pain, no hematuria   MS:  No joint pain or swelling.  No decreased range of motion.  No back pain.    Physical Exam  BP (!) 86/50 (BP Location: Left Arm, Cuff Size: Normal)   Pulse 72   Temp 98.7 F (37.1 C) (Temporal)   Ht 5' 2.5" (1.588 m)   Wt 134 lb 6.4 oz (61 kg)   SpO2 99% Comment: RA  BMI 24.19 kg/m   GEN: A/Ox3; pleasant , NAD, well nourished    HEENT:  [place]/AT,   , NOSE-clear, THROAT-clear, no lesions, no postnasal drip or exudate noted.   NECK:  Supple w/ fair ROM; no JVD; normal carotid impulses w/o bruits; no thyromegaly or nodules palpated; no lymphadenopathy.    RESP  Clear  P & A; w/o, wheezes/ rales/ or rhonchi. no accessory muscle use, no dullness to percussion  CARD:  RRR, no m/r/g, no peripheral edema, pulses intact, no cyanosis or clubbing.  GI:   Soft & nt; nml bowel sounds; no organomegaly or  masses detected.   Musco: Warm bil, no deformities or joint swelling noted.   Neuro: alert, no focal deficits noted.    Skin: Warm, scattered hyperpigmented areas along her back.    Lab Results:  BMET  BNP No results found for: BNP  ProBNP No results found for: PROBNP  Imaging: DG Chest 2 View  Result Date: 09/05/2019 CLINICAL DATA:  Sarcoidosis. EXAM: CHEST - 2 VIEW COMPARISON:  September 27, 2018. FINDINGS: The heart size and mediastinal contours are within normal limits. No pneumothorax or pleural effusion is noted stable chronic interstitial densities are noted throughout both lungs consistent with history of sarcoidosis. No definite acute abnormality is noted. The visualized skeletal structures are unremarkable. IMPRESSION: Stable chronic interstitial densities throughout both lungs consistent with history of sarcoidosis. No acute abnormality is noted Electronically Signed   By: Lupita Raider M.D.   On: 09/05/2019 17:07      PFT Results Latest Ref Rng & Units 07/29/2013  FVC-Pre L 2.62  FVC-Predicted Pre % 89  FVC-Post L 2.62  FVC-Predicted Post % 89  Pre FEV1/FVC % % 84  Post FEV1/FCV % % 85  FEV1-Pre L 2.19  FEV1-Predicted Pre % 88  FEV1-Post L 2.24  DLCO uncorrected ml/min/mmHg 17.75  DLCO UNC% % 82  DLVA Predicted % 105  TLC L 3.57  TLC % Predicted % 75  RV % Predicted % 60    No results found for: NITRICOXIDE      Assessment & Plan:   Sarcoidosis Mild flare off of prednisone.  Will restart and taper back down to 5 mg every other day.  Continue on Plaquenil.  Labs today.  Plan  Patient Instructions  Continue on Plaquenil.  Chest and labs today .  Restart Prednisone 20mg  daily for 1 week then 10mg  daily for 1 week then 5mg  daily for 1 week and 5mg  every other day . Hold at this dose  Follow up with Dr.  In 3 months and As needed   Follow up with Dermatology tomorrow.  Please contact office for sooner follow up if symptoms do not improve or  worsen or seek emergency care       Chronic rhinitis Cont on current regimen       , NP 09/05/2019

## 2019-09-05 NOTE — Assessment & Plan Note (Signed)
Cont on current regimen  

## 2019-09-19 ENCOUNTER — Ambulatory Visit: Payer: 59 | Admitting: Internal Medicine

## 2019-12-15 ENCOUNTER — Encounter: Payer: Self-pay | Admitting: Internal Medicine

## 2019-12-15 ENCOUNTER — Other Ambulatory Visit: Payer: Self-pay

## 2019-12-15 ENCOUNTER — Ambulatory Visit (INDEPENDENT_AMBULATORY_CARE_PROVIDER_SITE_OTHER): Payer: Self-pay | Admitting: Internal Medicine

## 2019-12-15 DIAGNOSIS — D869 Sarcoidosis, unspecified: Secondary | ICD-10-CM

## 2019-12-15 MED ORDER — PREDNISONE 10 MG PO TABS
ORAL_TABLET | ORAL | 1 refills | Status: DC
Start: 2019-12-15 — End: 2020-05-09

## 2019-12-15 NOTE — Patient Instructions (Addendum)
No change in medications but let dermatologist prescribe any creams you need for your rash    I very strongly recommend you get the moderna or pfizer vaccine as soon as possible based on your risk of dying from the virus  and the proven safety and benefit of these vaccines against even the delta variant.  This can save your life as well as  those of your loved ones,  especially if they are also not vaccinated.     Please schedule a follow up visit in 12 months but call sooner if needed

## 2019-12-15 NOTE — Assessment & Plan Note (Signed)
Clinical dx only with symptoms starting ? 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    - Angiotensin 09/27/2018  = 141 prior to any rx     - Plaquenil 400 mg daily x 09/30/2018     - taper off prednisone by end of Dec 2020 > skin flared w/in a month as did nasal symptoms     - ACE  03/11/19 =  40 on plaquenil 400/ pred 5 mg daily   No evidence of pulmonary or significant systemic flares, appears to be limited to skin and controlling with maint plaquenil and prn pred  The goal with a chronic steroid dependent illness is always arriving at the lowest effective dose that controls the disease/symptoms and not accepting a set "formula" which is based on statistics or guidelines that don't always take into account patient  variability or the natural hx of the dz in every individual patient, which may well vary over time.  For now therefore I recommend the patient maintain ceiling of 20 mg and floor of 0 tapering rapidly off when skin is better.  >>> f/u derm/ opth regularly  And here q 12 m, sooner prn          Each maintenance medication was reviewed in detail including emphasizing most importantly the difference between maintenance and prns and under what circumstances the prns are to be triggered using an action plan format where appropriate.  Total time for H and P, chart review, counseling,   and generating customized AVS unique to this office visit / charting = 32 min

## 2019-12-15 NOTE — Progress Notes (Signed)
Subjective:   Patient ID: Katelyn Stevens, female    DOB: 1980-07-27   MRN: [id number]    Brief patient profile:  39   yobf quit smoking 2012 then starting November 2012 onset of nasal congestion then cough and sob in winter of 2013 and then lymph node swelling spring 2013 and referred 08/21/2011 to pulmonary clinic by her brother with strong fm hx of sarcoid with skin bx c/w sarcoid 09/2018    History of Present Illness   08/21/2011 1st pulmonary eval cc can't breath through nose and green nasal drainage, much better p prednisone x 2 weeks, much worse off it.  Min arthralgias, min sob better on prednisone, no occular complaints or rash. rec augmentin x 10 days one twice daily (yogurt for lunch) Nasal saline irrigation and stop sinex unless you have to one side to sleep x 5 days only Prednisone 10 mg x 2 each until better then one each am Please schedule a follow up office visit in 6 weeks, call sooner if needed with cxr. I do recommend you see an MD eye doctor for a sarcoid eye exam > did not go    11/18/2011 f/u ov/Otto Caraway  reduced to 5 mg one daily and ok for a week, then nasal congestion flared then increased to 7.5  Then 10 mg per day x 3 days and no better with afrin. rec I emphasized that nasal steroids (nasonex)have no immediate benefit    Augmentin x 10 days one twice daily (yogurt for lunch) Prednisone 20 mg daily  until better then 10 mg per day x 5 days and then taper to qod Nasonex two puffs twice daily taper to one at bedtime if better into each nostril I do recommend you see an MD eye doctor for a sarcoid eye exam- > neg     09/27/2018   ov/Katelyn Stevens re:  Pulmonary consultation re ? Recurrent sarcoidosis ? Pt with sister / uncle  Chief Complaint  Patient presents with  . Pulmonary Consult    Last seen for Sarcoid in 2015. She is c/o nasal congestion "for a while". She has had a rash and was seen by derm 09/16/18.   did not req otc nasal steroids for years  Then started back  spring/summer 2020 and has not resumed steroids  Rash started about the same time Dyspnea:  Not limited by breathing from desired activities   Cough: none  Sleeping: nasal congestion bothers her  SABA use: none 02: none  Rash is on face and post torso, upper arms  - no pruritis  rec Prednisone 10 mg x 2 daily x 5 days and 5 mg daily x 5 days and stop  Afrin / nasacort Aq as before    11/08/2018  f/u ov/Abdulai Blaylock re: sarcoidosis with severe rash, on plaquenil 400 x 09/30/2018  Chief Complaint  Patient presents with  . Follow-up    Nasal congestion has not improved.   Dyspnea:  Not limited by breathing from desired activities  x 8 min Cough: no Sleeping: no resp c/o's  SABA use: none  02: none  Eyes ok per w/u in oct 2020   rec Prednsione 10 mg x 2 until better, then 1 daily x 2 days, then one half daily       12/20/2018  f/u ov/Katelyn Stevens re: sarcoid mostly skin and some rhinitis and 100% better / weaned pred to 5 mg daily on plaqueninl 400 mg daily per derm  Chief Complaint  Patient presents with  .  Follow-up    Pt states she is doing better since last visit and denies any complaints.  Dyspnea:  8 min mile jogging s sob   Cough: no/ nasal congestion also better  Sleeping: no resp symptoms  SABA use: none 02: none  rec Prednisone 10 mg one half even days x 2 weeks then stop - if worse resume previous dose  Plaquenil to be dosed by dermatology so pulmonary follow up is as needed    03/11/2019  f/u ov/Katelyn Stevens re: ok for a month then back to 10 mg one half  Chief Complaint  Patient presents with  . Follow-up    Sarcoidois   Dyspnea:  No sob even with aerobics  Cough: no but nasal symptoms also worse off prednisone  Sleeping: no resp cc SABA use none 02: none  rec Ceiling for prednisone is 10 mg x 2 each am and floor is one half every other day    12/15/2019  f/u ov/Katelyn Stevens re: sarcoid on plaquenil  400 mg daily plus short courses of prednisone for skin flares only  Chief  Complaint  Patient presents with  . Follow-up    Rash on back of her arms- takes pred when she feels she needs it.   Dyspnea:  Not limited by breathing from desired activities   Cough: some nasal drainage/ nasacort helps  Sleeping: no resp problem flat bed with 30 degrees with pillows  SABA use: none  02: none  Take 10 mg x 2  X 3-4 days until rash is better then 1 daily x 4 days then one half every other about 4 days and on avg flares every 3-4 months as is the case now  - denies missing doses of plaquenil and says derm aware /agrees with prn prednisone. Has regular eye checks and derm checking labs / doing plaquenil refills   No obvious day to day or daytime variability or assoc excess/ purulent sputum or mucus plugs or hemoptysis or cp or chest tightness, subjective wheeze or overt sinus or hb symptoms.   Sleeping as above  without nocturnal  or early am exacerbation  of respiratory  c/o's or need for noct saba. Also denies any obvious fluctuation of symptoms with weather or environmental changes or other aggravating or alleviating factors except as outlined above   No unusual exposure hx or h/o childhood pna/ asthma or knowledge of premature birth.  Current Allergies, Complete Past Medical History, Past Surgical History, Family History, and Social History were reviewed in [employer] record.  ROS  The following are not active complaints unless bolded Hoarseness, sore throat, dysphagia, dental problems, itching, sneezing,  nasal congestion or discharge of excess mucus or purulent secretions, ear ache,   fever, chills, sweats, unintended wt loss or wt gain, classically pleuritic or exertional cp,  orthopnea pnd or arm/hand swelling  or leg swelling, presyncope, palpitations, abdominal pain, anorexia, nausea, vomiting, diarrhea  or change in bowel habits or change in bladder habits, change in stools or change in urine, dysuria, hematuria,  rash, arthralgias, visual  complaints, headache, numbness, weakness or ataxia or problems with walking or coordination,  change in mood or  memory.        Current Meds  Medication Sig  . hydroxychloroquine (PLAQUENIL) 200 MG tablet Take 400 mg by mouth daily.  . predniSONE (DELTASONE) 10 MG tablet 2 tabs for 7 days, then 1 tabs for 7 days, 1/2  Tab for 7 days, then 1/2 tab every other day  .  triamcinolone cream (KENALOG) 0.1 % As directed                     Objective:   Physical Exam   12/15/2019    135  03/11/2019      133 12/20/2018  130  11/08/2018    131  Wt 10/07/2011  126 vs 133  01/02/2012    Vital signs reviewed  12/15/2019  - Note at rest 02 sats  98% on RA  amb pleasant bf nad    HEENT : pt wearing mask not removed for exam due to covid -19 concerns.    NECK :  without JVD/Nodes/TM/ nl carotid upstrokes bilaterally   LUNGS: no acc muscle use,  Nl contour chest which is clear to A and P bilaterally without cough on insp or exp maneuvers   CV:  RRR  no s3 or murmur or increase in P2, and no edema   ABD:  soft and nontender with nl inspiratory excursion in the supine position. No bruits or organomegaly appreciated, bowel sounds nl  MS:  Nl gait/ ext warm without deformities, calf tenderness, cyanosis or clubbing No obvious joint restrictions   SKIN:  Patchy dry scaly slt purplish macular rash over post chest wall/ back   NEURO:  alert, approp, nl sensorium with  no motor or cerebellar deficits apparent.          I personally reviewed images and agree with radiology impression as follows:  CXR:   09/05/19 Stable chronic interstitial densities throughout both lungs consistent with history of sarcoidosis. No acute abnormality is noted  my review:  ILD is minimal           Assessment & Plan:

## 2020-05-09 ENCOUNTER — Other Ambulatory Visit: Payer: Self-pay | Admitting: Internal Medicine

## 2020-06-11 ENCOUNTER — Telehealth: Payer: Self-pay | Admitting: Internal Medicine

## 2020-06-11 NOTE — Telephone Encounter (Signed)
Called and spoke with pt who states she has had phlegm in throat x4 days which has been making her clear her throat a lot. Pt also has had a cough x3 days and has been able to get up some phlegm which is yellow in color.   Pt has had a runny nose with clear nasal drainage. Pt took a covid test 6/1 which came back negative.  Pt denies any complaints of fever, has not checked temp but does not feel feverish.  Pt has prednisone prescribed that she takes as needed. States that she did have a flare up about a week ago and did take the prednisone to help with her sarcoid flare.  Pt has done some OTC chest rub as well as tylenol to see if it would help with her chest discomfort.  Pt requested to have an appt scheduled so I have scheduled pt an appt with MW tomorrow 6/7 at 1:30. Nothing further needed.

## 2020-06-12 ENCOUNTER — Other Ambulatory Visit: Payer: Self-pay

## 2020-06-12 ENCOUNTER — Encounter: Payer: Self-pay | Admitting: Internal Medicine

## 2020-06-12 ENCOUNTER — Ambulatory Visit (INDEPENDENT_AMBULATORY_CARE_PROVIDER_SITE_OTHER): Payer: Self-pay | Admitting: Internal Medicine

## 2020-06-12 DIAGNOSIS — R051 Acute cough: Secondary | ICD-10-CM | POA: Insufficient documentation

## 2020-06-12 DIAGNOSIS — D869 Sarcoidosis, unspecified: Secondary | ICD-10-CM

## 2020-06-12 MED ORDER — AZITHROMYCIN 250 MG PO TABS: ORAL_TABLET | ORAL | 0 refills | Status: DC

## 2020-06-12 NOTE — Progress Notes (Signed)
Subjective:   Patient ID: Katelyn Stevens, female    DOB: [birth year]   MRN: [id number]    Brief patient profile:  39   yobf quit smoking 2012 then starting November 2012 onset of nasal congestion then cough and sob in winter of 2013 and then lymph node swelling spring 2013 and referred 08/21/2011 to pulmonary clinic by her brother with strong fm hx of sarcoid with skin bx c/w sarcoid 09/2018    History of Present Illness   08/21/2011 1st pulmonary eval cc can't breath through nose and green nasal drainage, much better p prednisone x 2 weeks, much worse off it.  Min arthralgias, min sob better on prednisone, no occular complaints or rash. rec augmentin x 10 days one twice daily (yogurt for lunch) Nasal saline irrigation and stop sinex unless you have to one side to sleep x 5 days only Prednisone 10 mg x 2 each until better then one each am Please schedule a follow up office visit in 6 weeks, call sooner if needed with cxr. I do recommend you see an MD eye doctor for a sarcoid eye exam > did not go    11/18/2011 f/u ov/Katelyn Stevens  reduced to 5 mg one daily and ok for a week, then nasal congestion flared then increased to 7.5  Then 10 mg per day x 3 days and no better with afrin. rec I emphasized that nasal steroids (nasonex)have no immediate benefit    Augmentin x 10 days one twice daily (yogurt for lunch) Prednisone 20 mg daily  until better then 10 mg per day x 5 days and then taper to qod Nasonex two puffs twice daily taper to one at bedtime if better into each nostril I do recommend you see an MD eye doctor for a sarcoid eye exam- > neg     09/27/2018   ov/Katelyn Stevens re:  Pulmonary consultation re ? Recurrent sarcoidosis ? Pt with sister / uncle  Chief Complaint  Patient presents with  . Pulmonary Consult    Last seen for Sarcoid in 2015. She is c/o nasal congestion "for a while". She has had a rash and was seen by derm 09/16/18.   did not req otc nasal steroids for years  Then started back  spring/summer 2020 and has not resumed steroids  Rash started about the same time Dyspnea:  Not limited by breathing from desired activities   Cough: none  Sleeping: nasal congestion bothers her  SABA use: none 02: none  Rash is on face and post torso, upper arms  - no pruritis  rec Prednisone 10 mg x 2 daily x 5 days and 5 mg daily x 5 days and stop  Afrin / nasacort Aq as before    11/08/2018  f/u ov/Katelyn Stevens re: sarcoidosis with severe rash, on plaquenil 400 x 09/30/2018  Chief Complaint  Patient presents with  . Follow-up    Nasal congestion has not improved.   Dyspnea:  Not limited by breathing from desired activities  x 8 min Cough: no Sleeping: no resp c/o's  SABA use: none  02: none  Eyes ok per w/u in oct 2020   rec Prednsione 10 mg x 2 until better, then 1 daily x 2 days, then one half daily       12/20/2018  f/u ov/Katelyn Stevens re: sarcoid mostly skin and some rhinitis and 100% better / weaned pred to 5 mg daily on plaqueninl 400 mg daily per derm  Chief Complaint  Patient presents with  .  Follow-up    Pt states she is doing better since last visit and denies any complaints.  Dyspnea:  8 min mile jogging s sob   Cough: no/ nasal congestion also better  Sleeping: no resp symptoms  SABA use: none 02: none  rec Prednisone 10 mg one half even days x 2 weeks then stop - if worse resume previous dose  Plaquenil to be dosed by dermatology so pulmonary follow up is as needed    03/11/2019  f/u ov/Katelyn Stevens re: ok for a month then back to 10 mg one half  Chief Complaint  Patient presents with  . Follow-up    Sarcoidois   Dyspnea:  No sob even with aerobics  Cough: no but nasal symptoms also worse off prednisone  Sleeping: no resp cc SABA use none 02: none  rec Ceiling for prednisone is 10 mg x 2 each am and floor is one half every other day    12/15/2019  f/u ov/Katelyn Stevens re: sarcoid on plaquenil  400 mg daily plus short courses of prednisone for skin flares only  Chief  Complaint  Patient presents with  . Follow-up    Rash on back of her arms- takes pred when she feels she needs it.   Dyspnea:  Not limited by breathing from desired activities   Cough: some nasal drainage/ nasacort helps  Sleeping: no resp problem flat bed with 30 degrees with pillows  SABA use: none  02: none  Take 10 mg x 2  X 3-4 days until rash is better then 1 daily x 4 days then one half every other about 4 days and on avg flares every 3-4 months as is the case now  - denies missing doses of plaquenil and says derm aware /agrees with prn prednisone. Has regular eye checks and derm checking labs / doing plaquenil refills rec No change in medications but let dermatologist prescribe any creams you need for your rash   I very strongly recommend you get the moderna or pfizer vaccine as soon as possible based on your risk of dying from the virus  and the proven safety and benefit of these vaccines against even the delta variant.  This can save your life as well as  those of your loved ones,  especially if they are also not vaccinated.   06/12/2020  Acute ov/Katelyn Stevens re:  Plaquenil 400 mg per day/ last pred was around  10 days prior to ov/   Typically every month or two needs pred due to rash flare  Chief Complaint  Patient presents with  . Acute Visit    Pt c/o throat clearing, cough with yellow sputum for about a wk.   Dyspnea: no doe-  ran one day prior  Cough: x one week / slt yellow assoc nasal congestion  Sleeping: ok  SABA use: none  02: none  Covid status: never vaccinated or infected  No sore throat or fever or aches. No contacts    No obvious day to day or daytime variability or assoc excess/ purulent sputum or mucus plugs or hemoptysis or cp or chest tightness, subjective wheeze or overt  hb symptoms.   Sleeping  without nocturnal  or early am exacerbation  of respiratory  c/o's or need for noct saba. Also denies any obvious fluctuation of symptoms with weather or environmental  changes or other aggravating or alleviating factors except as outlined above   No unusual exposure hx or h/o childhood pna/ asthma or knowledge of premature  birth.  Current Allergies, Complete Past Medical History, Past Surgical History, Family History, and Social History were reviewed in [employer] record.  ROS  The following are not active complaints unless bolded Hoarseness, sore throat, dysphagia, dental problems, itching, sneezing,  nasal congestion or discharge of excess mucus or purulent secretions, ear ache,   fever, chills, sweats, unintended wt loss or wt gain, classically pleuritic or exertional cp,  orthopnea pnd or arm/hand swelling  or leg swelling, presyncope, palpitations, abdominal pain, anorexia, nausea, vomiting, diarrhea  or change in bowel habits or change in bladder habits, change in stools or change in urine, dysuria, hematuria,  rash, arthralgias, visual complaints, headache, numbness, weakness or ataxia or problems with walking or coordination,  change in mood or  memory.        Current Meds  Medication Sig  . hydroxychloroquine (PLAQUENIL) 200 MG tablet Take 400 mg by mouth daily.  Marland Kitchen triamcinolone (NASACORT) 55 MCG/ACT AERO nasal inhaler Place 2 sprays into the nose daily as needed.  . triamcinolone cream (KENALOG) 0.1 % As directed                                     Objective:   Physical Exam  06/12/2020      138 12/15/2019    135  03/11/2019      133 12/20/2018  130  11/08/2018    131  Wt 10/07/2011  126 vs 133  01/02/2012    Vital signs reviewed  06/12/2020  - Note at rest 02 sats  98% on RA   General appearance:    amb pleasant bf nad   HEENT : pt wearing mask not removed for exam due to covid -19 concerns.    NECK :  without JVD/Nodes/TM/ nl carotid upstrokes bilaterally   LUNGS: no acc muscle use,  Nl contour chest which is clear to A and P bilaterally without cough on insp or exp maneuvers   CV:  RRR  no s3  or murmur or increase in P2, and no edema   ABD:  soft and nontender with nl inspiratory excursion in the supine position. No bruits or organomegaly appreciated, bowel sounds nl  MS:  Nl gait/ ext warm without deformities, calf tenderness, cyanosis or clubbing No obvious joint restrictions   SKIN: warm and dry with classic MP purplish rash over back   NEURO:  alert, approp, nl sensorium with  no motor or cerebellar deficits apparent.                                Assessment & Plan:

## 2020-06-12 NOTE — Assessment & Plan Note (Signed)
Clinical dx only with symptoms starting ? 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    - Angiotensin 09/27/2018  = 141 prior to any rx     - Plaquenil 400 mg daily x 09/30/2018     - taper off prednisone by end of Dec 2020 > skin flared w/in a month as did nasal symptoms     - ACE  03/11/19 =  40 on plaquenil 400/ pred 5 mg daily   Main manifestation is dermatologic so f/b dermatology on maint plaquenil plus using short dcoruse low dose prednisone to control  Discussed in detail all the  indications, usual  risks and alternatives  relative to the benefits with patient who agrees to proceed with Rx as outlined.

## 2020-06-12 NOTE — Assessment & Plan Note (Addendum)
Onset 06/06/20 with initial covid neg   Recheck covid Ag and if neg rec covid vaccination as per prior recs zpak for rhinitis/ bronchitis  Delsym or mucinex dm   F/u in 2 weeks if not better, o/w see back with cxr in 12 m         Each maintenance medication was reviewed in detail including emphasizing most importantly the difference between maintenance and prns and under what circumstances the prns are to be triggered using an action plan format where appropriate.  Total time for H and P, chart review, counseling,  and generating customized AVS unique to this office visit / same day charting = 25 min

## 2020-06-12 NOTE — Patient Instructions (Addendum)
zpak   For cough  >> Delsym  Tsp every 12 hours  or mucinex dm  Up to 1200 mg twice daily   Would do  one more covid test to be sure you don't have it   Please schedule a follow up visit in 12  months but call sooner if needed with cxr on return

## 2020-08-28 ENCOUNTER — Telehealth: Payer: Self-pay | Admitting: Internal Medicine

## 2020-08-28 MED ORDER — HYDROXYCHLOROQUINE SULFATE 200 MG PO TABS
400.0000 mg | ORAL_TABLET | Freq: Every day | ORAL | 3 refills | Status: DC
Start: 2020-08-28 — End: 2021-04-30

## 2020-08-28 NOTE — Telephone Encounter (Signed)
Given enough to last until ov in 12/2020

## 2020-08-28 NOTE — Telephone Encounter (Signed)
Patient requesting refill on Plaquenil, it is listed under historical provider. Patient was last seen in office on 06/12/20. Is it ok to refill?  Dr. Sherene Sires please advise  Thank you

## 2020-08-28 NOTE — Telephone Encounter (Signed)
I have called and LM on VM for the pt to make her aware of medication that has been sent to the pharmacy.  Nothing further is needed.

## 2020-10-11 ENCOUNTER — Encounter: Payer: Self-pay | Admitting: Internal Medicine

## 2020-10-11 ENCOUNTER — Other Ambulatory Visit: Payer: Self-pay

## 2020-10-11 ENCOUNTER — Ambulatory Visit (INDEPENDENT_AMBULATORY_CARE_PROVIDER_SITE_OTHER): Payer: Self-pay | Admitting: Internal Medicine

## 2020-10-11 DIAGNOSIS — D869 Sarcoidosis, unspecified: Secondary | ICD-10-CM

## 2020-10-11 MED ORDER — TRIAMCINOLONE ACETONIDE 0.1 % EX CREA: TOPICAL_CREAM | CUTANEOUS | 11 refills | Status: DC

## 2020-10-11 MED ORDER — PREDNISONE 10 MG PO TABS: ORAL_TABLET | ORAL | 1 refills | Status: DC

## 2020-10-11 NOTE — Patient Instructions (Signed)
No change on the prednisone or plaquenil   Please schedule a follow up visit in 6  months but call sooner if needed

## 2020-10-11 NOTE — Progress Notes (Signed)
Subjective:   Patient ID: Katelyn Stevens, female    DOB: 06-10-1980   MRN: [id number]    Brief patient profile:  40   yobf quit smoking 2012 then starting November 2012 onset of nasal congestion then cough and sob in winter of 2013 and then lymph node swelling spring 2013 and referred 08/21/2011 to pulmonary clinic by her brother with strong fm hx of sarcoid with skin bx c/w sarcoid 09/2018    History of Present Illness   08/21/2011 1st pulmonary eval cc can't breath through nose and green nasal drainage, much better p prednisone x 2 weeks, much worse off it.  Min arthralgias, min sob better on prednisone, no occular complaints or rash. rec augmentin x 10 days one twice daily (yogurt for lunch) Nasal saline irrigation and stop sinex unless you have to one side to sleep x 5 days only Prednisone 10 mg x 2 each until better then one each am Please schedule a follow up office visit in 6 weeks, call sooner if needed with cxr. I do recommend you see an MD eye doctor for a sarcoid eye exam > did not go    12/20/2018  f/u ov/Lakresha Stifter re: sarcoid mostly skin and some rhinitis and 100% better / weaned pred to 5 mg daily on plaqueninl 400 mg daily per derm  Chief Complaint  Patient presents with   Follow-up    Pt states she is doing better since last visit and denies any complaints.  Dyspnea:  8 min mile jogging s sob   Cough: no/ nasal congestion also better  Sleeping: no resp symptoms  SABA use: none 02: none  rec Prednisone 10 mg one half even days x 2 weeks then stop - if worse resume previous dose  Plaquenil to be dosed by dermatology so pulmonary follow up is as needed    03/11/2019  f/u ov/Krrish Freund re: ok for a month then back to 10 mg one half  Chief Complaint  Patient presents with   Follow-up    Sarcoidois   Dyspnea:  No sob even with aerobics  Cough: no but nasal symptoms also worse off prednisone  Sleeping: no resp cc SABA use none 02: none  rec Ceiling for prednisone is 10 mg x 2  each am and floor is one half every other day      06/12/2020  Acute ov/Dalani Mette re:  Plaquenil 400 mg per day/ last pred was around  10 days prior to ov/   Typically every month or two needs pred due to rash flare  Chief Complaint  Patient presents with   Acute Visit    Pt c/o throat clearing, cough with yellow sputum for about a wk.   Dyspnea: no doe-  ran one day prior  Cough: x one week / slt yellow assoc nasal congestion  Sleeping: ok  SABA use: none  02: none  Covid status: never vaccinated or infected  No sore throat or fever or aches. No contacts  Rec zpak  For cough  >> Delsym  Tsp every 12 hours  or mucinex dm  Up to 1200 mg twice daily  Would do  one more covid test to be sure you don't have it  Please schedule a follow up visit in 12  months but call sooner if needed with cxr on return    10/11/2020  f/u ov/Gareld Obrecht re: sarcoid maint on plaquenil 400 mg per day and pred 20 x UB > taper down one half qod  -  last pred 20 was end of July 2022   Chief Complaint  Patient presents with   Follow-up    Patient states that she is having a sarcoid flare up, started about 3 days ago.   Dyspnea:  no sob  Cough: some nasal congestion, white thick Sleeping: no resp cc  SABA use: inhalers 02: noe  Covid status:   never vaccinated    No obvious day to day or daytime variability or assoc excess/ purulent sputum or mucus plugs or hemoptysis or cp or chest tightness, subjective wheeze or overt sinus or hb symptoms.   Sleeping  without nocturnal  or early am exacerbation  of respiratory  c/o's or need for noct saba. Also denies any obvious fluctuation of symptoms with weather or environmental changes or other aggravating or alleviating factors except as outlined above   No unusual exposure hx or h/o childhood pna/ asthma or knowledge of premature birth.  Current Allergies, Complete Past Medical History, Past Surgical History, Family History, and Social History were reviewed in [employer] record.  ROS  The following are not active complaints unless bolded Hoarseness, sore throat, dysphagia, dental problems, itching, sneezing,  nasal congestion or discharge of excess mucus or purulent secretions, ear ache,   fever, chills, sweats, unintended wt loss or wt gain, classically pleuritic or exertional cp,  orthopnea pnd or arm/hand swelling  or leg swelling, presyncope, palpitations, abdominal pain, anorexia, nausea, vomiting, diarrhea  or change in bowel habits or change in bladder habits, change in stools or change in urine, dysuria, hematuria,  rash, arthralgias, visual complaints, headache, numbness, weakness or ataxia or problems with walking or coordination,  change in mood or  memory.        Current Meds  Medication Sig   hydroxychloroquine (PLAQUENIL) 200 MG tablet Take 2 tablets (400 mg total) by mouth daily.   predniSONE (DELTASONE) 10 MG tablet TAKE 2 TABLETS BY MOUTH EVERY DAY WITH BREAKFAST UNTIL BETTER, THEN 1 EVERY DAY FOR 2 WEEKS, THEN 1/2 DAILY   triamcinolone (NASACORT) 55 MCG/ACT AERO nasal inhaler Place 2 sprays into the nose daily as needed.   triamcinolone cream (KENALOG) 0.1 % As directed                   Objective:   Physical Exam  10/11/2020    145   06/12/2020      138 12/15/2019    135  03/11/2019      133 12/20/2018  130  11/08/2018    131  Wt 10/07/2011  126 vs 133  01/02/2012    Vital signs reviewed  10/11/2020  - Note at rest 02 sats  99% on RA    General appearance:    amb bf nad        HEENT : pt wearing mask not removed for exam due to covid -19 concerns.    NECK :  without JVD/Nodes/TM/ nl carotid upstrokes bilaterally   LUNGS: no acc muscle use,  Nl contour chest which is clear to A and P bilaterally without cough on insp or exp maneuvers   CV:  RRR  no s3 or murmur or increase in P2, and no edema   ABD:  soft and nontender with nl inspiratory excursion in the supine position. No bruits or organomegaly  appreciated, bowel sounds nl  MS:  Nl gait/ ext warm without deformities, calf tenderness, cyanosis or clubbing No obvious joint restrictions   SKIN: warm and dry with  typical purplish MP rash scattered over trunk posteriorly   NEURO:  alert, approp, nl sensorium with  no motor or cerebellar deficits apparent.         Labs ordered 10/11/2020  :  did not go to lab as requested                      Assessment & Plan:

## 2020-10-13 ENCOUNTER — Encounter: Payer: Self-pay | Admitting: Internal Medicine

## 2020-10-13 NOTE — Assessment & Plan Note (Signed)
Clinical dx only with symptoms starting ? 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    - Angiotensin 09/27/2018  = 141 prior to any rx     - Plaquenil 400 mg daily x 09/30/2018     - taper off prednisone by end of Dec 2020 > skin flared w/in a month as did nasal symptoms     - ACE  03/11/19 =  38 on plaquenil 400/ pred 5 mg dail  Continues with predominantly skin flares s resp symptoms other than mild rhinitis which may or may not be related to sarcoid   The goal with a chronic steroid dependent illness is always arriving at the lowest effective dose that controls the disease/symptoms and not accepting a set "formula" which is based on statistics or guidelines that don't always take into account patient  variability or the natural hx of the dz in every individual patient, which may well vary over time.  For now therefore I recommend the patient maintain  Ceiling of 20 and floor of 5 mg qod to supplement plaquenil 400 mg daily and f/u q 6 m  Sent reminder re retur for labs         Each maintenance medication was reviewed in detail including emphasizing most importantly the difference between maintenance and prns and under what circumstances the prns are to be triggered using an action plan format where appropriate.  Total time for H and P, chart review, counseling,  and generating customized AVS unique to this office visit / same day charting = 25 min

## 2020-10-19 ENCOUNTER — Telehealth: Payer: Self-pay | Admitting: *Deleted

## 2020-10-19 NOTE — Telephone Encounter (Signed)
-----   Message from Nyoka Cowden, MD sent at 10/13/2020  8:12 AM EDT ----- Remind needs to return to lab so we can continue to refill her plaquenil

## 2020-10-19 NOTE — Telephone Encounter (Signed)
Spoke with the pt and told her needs to come in for labs so that we can continue current meds  She verbalized understanding and states that she will do this  Nothing further needed

## 2020-12-14 ENCOUNTER — Ambulatory Visit: Payer: Medicaid Other | Admitting: Internal Medicine

## 2021-02-26 ENCOUNTER — Telehealth: Payer: Self-pay | Admitting: Internal Medicine

## 2021-02-26 NOTE — Telephone Encounter (Signed)
Spoke to patient. She stated that she contacted her pharmacy and requested a refill on prednisone as her current Rx had refills available.  Nothing further needed at this time.

## 2021-04-12 ENCOUNTER — Ambulatory Visit: Payer: Medicaid Other | Admitting: Internal Medicine

## 2021-04-30 ENCOUNTER — Encounter: Payer: Self-pay | Admitting: Primary Care

## 2021-04-30 ENCOUNTER — Ambulatory Visit: Payer: Medicaid Other | Admitting: Internal Medicine

## 2021-04-30 ENCOUNTER — Ambulatory Visit (INDEPENDENT_AMBULATORY_CARE_PROVIDER_SITE_OTHER): Payer: Self-pay | Admitting: Primary Care

## 2021-04-30 VITALS — BP 104/70 | HR 94 | Temp 98.4°F | Ht 63.0 in | Wt 150.4 lb

## 2021-04-30 DIAGNOSIS — D869 Sarcoidosis, unspecified: Secondary | ICD-10-CM

## 2021-04-30 DIAGNOSIS — Z634 Disappearance and death of family member: Secondary | ICD-10-CM

## 2021-04-30 DIAGNOSIS — F4321 Adjustment disorder with depressed mood: Secondary | ICD-10-CM

## 2021-04-30 MED ORDER — PREDNISONE 10 MG PO TABS: ORAL_TABLET | ORAL | 1 refills | Status: DC

## 2021-04-30 MED ORDER — HYDROXYCHLOROQUINE SULFATE 200 MG PO TABS: 400.0000 mg | ORAL_TABLET | Freq: Every day | ORAL | 5 refills | Status: DC

## 2021-04-30 NOTE — Progress Notes (Signed)
? ?@Patient  ID: Katelyn Stevens, female    DOB: 09-06-1980, 41 y.o.   MRN: [id number] ? ?Chief Complaint  ?Patient presents with  ? Follow-up  ?  Needs refill of prednisone and plaquenil.  Having a skin flare. ? ?  ? ? ?Referring provider: ?No ref. provider found ? ?HPI: ?41 year old female, former smoker quit in 2012. PMH significant for sarcoidosis, chronic rhinitis. Patient of Dr. Melvyn Novas.  ? ?Clinical dx only with symptoms starting ? Nov 2012 mostly nasal  ?   - Chronic prednisone rx 08/22/2011 >  01/11/2013  ?   - Refer to opthamology 11/18/2011 >>> neg eval > referred back 04/01/12 for R eye swelling > 10/26/18 no sarcoid  ?   - PFT's 07/29/13 >  [facility] ?   - CXR 08/09/13  [facility]  ?   - new rash spring 2020>>>   bx 09/16/2018 ([facility]) : Granulomatous Dermatitis ?   - Angiotensin 09/27/2018  = 141 prior to any rx  ?   - Plaquenil 400 mg daily x 09/30/2018  ?   - taper off prednisone by end of Dec 2020 > skin flared w/in a month as did nasal symptoms  ? ?04/30/2021 ?Patient presents today for 6 month follow-up. She developed skin eruption on her back 3 days ago. She is on plaquinel 400mg  daily and has been taking 5mg  prednisone every other day for sarcoid. Needs refill. She has no respiratory symptoms. Denies sob, cough or chest pain. Of note, she lost her son in a car accident 2 weeks ago. She has support system in place.  ? ?Allergies  ?Allergen Reactions  ? Aspirin Other (See Comments)  ?  Facial swelling  ? ? ?There is no immunization history for the selected administration types on file for this patient. ? ?Past Medical History:  ?Diagnosis Date  ? Sarcoidosis   ? ? ?Tobacco History: ?Social History  ? ?Tobacco Use  ?Smoking Status Former  ? Packs/day: 0.50  ? Years: 6.00  ? Pack years: 3.00  ? Types: Cigarettes  ? Quit date: 08/07/2010  ? Years since quitting: 10.7  ?Smokeless Tobacco Never  ? ?Counseling given: Not Answered ? ? ?Outpatient Medications Prior to Visit  ?Medication Sig Dispense Refill  ? triamcinolone (NASACORT)  55 MCG/ACT AERO nasal inhaler Place 2 sprays into the nose daily as needed.    ? triamcinolone cream (KENALOG) 0.1 % Apply bid prn 45 g 11  ? hydroxychloroquine (PLAQUENIL) 200 MG tablet Take 2 tablets (400 mg total) by mouth daily. (Patient not taking: Reported on 04/30/2021) 60 tablet 3  ? predniSONE (DELTASONE) 10 MG tablet TAKE 2 TABLETS BY MOUTH EVERY DAY WITH BREAKFAST UNTIL BETTER, THEN 1 EVERY DAY FOR 2 WEEKS, THEN 1/2 DAILY (Patient not taking: Reported on 04/30/2021) 100 tablet 1  ? ?No facility-administered medications prior to visit.  ? ? ?Review of Systems ? ?Review of Systems  ?Constitutional: Negative.   ?Respiratory: Negative.    ?Skin:  Positive for rash.  ? ? ?Physical Exam ? ?BP 104/70 (BP Location: Left Arm, Patient Position: Sitting, Cuff Size: Normal)   Pulse 94   Temp 98.4 ?F (36.9 ?C) (Oral)   Ht 5\' 3"  (1.6 m)   Wt 150 lb 6.4 oz (68.2 kg)   SpO2 96%   BMI 26.64 kg/m?  ?Physical Exam ?Constitutional:   ?   Appearance: Normal appearance.  ?HENT:  ?   Head: Normocephalic and atraumatic.  ?   Mouth/Throat:  ?  Pharynx: Oropharynx is clear.  ?Cardiovascular:  ?   Rate and Rhythm: Normal rate and regular rhythm.  ?Pulmonary:  ?   Effort: Pulmonary effort is normal.  ?   Breath sounds: Normal breath sounds.  ?Musculoskeletal:     ?   General: Normal range of motion.  ?Skin: ?   Comments: Rash to posterior back  ?Neurological:  ?   General: No focal deficit present.  ?   Mental Status: She is alert and oriented to person, place, and time.  ?Psychiatric:     ?   Mood and Affect: Mood normal.     ?   Behavior: Behavior normal.     ?   Thought Content: Thought content normal.     ?   Judgment: Judgment normal.  ?  ? ?Lab Results: ? ?CBC ?   ?Component Value Date/Time  ? WBC 6.6 09/05/2019 1213  ? RBC 4.62 09/05/2019 1213  ? HGB 12.8 09/05/2019 1213  ? HCT 37.6 09/05/2019 1213  ? PLT 288.0 09/05/2019 1213  ? MCV 81.4 09/05/2019 1213  ? MCH 27.7 07/18/2013 1730  ? MCHC 34.1 09/05/2019 1213  ? RDW  13.5 09/05/2019 1213  ? LYMPHSABS 1.3 09/05/2019 1213  ? MONOABS 0.7 09/05/2019 1213  ? EOSABS 0.1 09/05/2019 1213  ? BASOSABS 0.1 09/05/2019 1213  ? ? ?BMET ?   ?Component Value Date/Time  ? NA 136 09/05/2019 1213  ? K 4.3 09/05/2019 1213  ? CL 105 09/05/2019 1213  ? CO2 26 09/05/2019 1213  ? GLUCOSE 83 09/05/2019 1213  ? BUN 7 09/05/2019 1213  ? CREATININE 0.81 09/05/2019 1213  ? CALCIUM 9.0 09/05/2019 1213  ? GFRNONAA >90 07/16/2011 1941  ? GFRAA >90 07/16/2011 1941  ? ? ?BNP ?No results found for: BNP ? ?ProBNP ?No results found for: PROBNP ? ?Imaging: ?No results found. ? ? ?Assessment & Plan:  ? ?Sarcoidosis ?- Patient developed skin eruption to her back 3 days ago. No associated respiratory or nasal symptoms. Currently maintained on Plaquenil 400mg  daily and 5mg  prednisone every other day. Instructed patient to take 20mg  prednisone daily until symptoms improve, then taper 10mg  daily x 2 weeks then 5mg  daily. Refills provided. Advised patient if rash does not resolve to contact office. FU in 6 months or sooner if needed.  ? ? ?Martyn Ehrich, NP ?04/30/2021 ? ?

## 2021-04-30 NOTE — Assessment & Plan Note (Signed)
-   Patient developed skin eruption to her back 3 days ago. No associated respiratory or nasal symptoms. Currently maintained on Plaquenil 400mg  daily and 5mg  prednisone every other day. Instructed patient to take 20mg  prednisone daily until symptoms improve, then taper 10mg  daily x 2 weeks then 5mg  daily. Refills provided. Advised patient if rash does not resolve to contact office. FU in 6 months or sooner if needed.  ?

## 2021-04-30 NOTE — Patient Instructions (Addendum)
Recommendations: ?Continue Plaquenil 400mg  daily ?Take prednisone 20mg  daily until symptoms improve, then taper 10mg  x 2 weeks; then 5mg  daily  ? ?Referral: ?Behavioral health ? ?Follow-up: ?6 months with Dr.  ?

## 2021-06-12 ENCOUNTER — Ambulatory Visit: Payer: Medicaid Other | Admitting: Internal Medicine

## 2021-10-31 ENCOUNTER — Ambulatory Visit: Payer: Medicaid Other | Admitting: Internal Medicine

## 2021-11-06 ENCOUNTER — Ambulatory Visit: Payer: Medicaid Other | Admitting: Internal Medicine

## 2021-11-06 NOTE — Progress Notes (Deleted)
Subjective:   Patient ID: Katelyn Stevens, female    DOB: 1981-01-03   MRN: [id number]    Brief patient profile:  27   yobf quit smoking 2012 then starting November 2012 onset of nasal congestion then cough and sob in winter of 2013 and then lymph node swelling spring 2013 and referred 08/21/2011 to pulmonary clinic by her brother with strong fm hx of sarcoid with skin bx c/w sarcoid 09/2018    History of Present Illness   08/21/2011 1st pulmonary eval cc can't breath through nose and green nasal drainage, much better p prednisone x 2 weeks, much worse off it.  Min arthralgias, min sob better on prednisone, no occular complaints or rash. rec augmentin x 10 days one twice daily (yogurt for lunch) Nasal saline irrigation and stop sinex unless you have to one side to sleep x 5 days only Prednisone 10 mg x 2 each until better then one each am Please schedule a follow up office visit in 6 weeks, call sooner if needed with cxr. I do recommend you see an MD eye doctor for a sarcoid eye exam > did not go    12/20/2018  f/u ov/Billie Intriago re: sarcoid mostly skin and some rhinitis and 100% better / weaned pred to 5 mg daily on plaqueninl 400 mg daily per derm  Chief Complaint  Patient presents with   Follow-up    Pt states she is doing better since last visit and denies any complaints.  Dyspnea:  8 min mile jogging s sob   Cough: no/ nasal congestion also better  Sleeping: no resp symptoms  SABA use: none 02: none  rec Prednisone 10 mg one half even days x 2 weeks then stop - if worse resume previous dose  Plaquenil to be dosed by dermatology so pulmonary follow up is as needed     10/11/2020  f/u ov/Dawud Mays re: sarcoid maint on plaquenil 400 mg per day and pred 20 x UB > taper down one half qod  - last pred 20 was end of July 2022   Chief Complaint  Patient presents with   Follow-up    Patient states that she is having a sarcoid flare up, started about 3 days ago.   Dyspnea:  no sob  Cough: some  nasal congestion, white thick Sleeping: no resp cc  SABA use: inhalers 02: noe  Covid status:   never vaccinated  Rec No change on the prednisone or plaquenil  Please schedule a follow up visit in 6  months but call sooner if needed   [facility] NP recs 04/30/21  Recommendations: Continue Plaquenil 400mg  daily Take prednisone 20mg  daily until symptoms improve, then taper 10mg  x 2 weeks; then 5mg  daily   Referral: Behavioral health  11/06/2021  f/u ov/Brazil Voytko re: ***   maint on ***  No chief complaint on file.   Dyspnea:  *** Cough: *** Sleeping: *** SABA use: *** 02: *** Covid status:   *** Lung cancer screening :  ***    No obvious day to day or daytime variability or assoc excess/ purulent sputum or mucus plugs or hemoptysis or cp or chest tightness, subjective wheeze or overt sinus or hb symptoms.   *** without nocturnal  or early am exacerbation  of respiratory  c/o's or need for noct saba. Also denies any obvious fluctuation of symptoms with weather or environmental changes or other aggravating or alleviating factors except as outlined above   No unusual exposure hx or h/o childhood  pna/ asthma or knowledge of premature birth.  Current Allergies, Complete Past Medical History, Past Surgical History, Family History, and Social History were reviewed in [employer] record.  ROS  The following are not active complaints unless bolded Hoarseness, sore throat, dysphagia, dental problems, itching, sneezing,  nasal congestion or discharge of excess mucus or purulent secretions, ear ache,   fever, chills, sweats, unintended wt loss or wt gain, classically pleuritic or exertional cp,  orthopnea pnd or arm/hand swelling  or leg swelling, presyncope, palpitations, abdominal pain, anorexia, nausea, vomiting, diarrhea  or change in bowel habits or change in bladder habits, change in stools or change in urine, dysuria, hematuria,  rash, arthralgias, visual complaints,  headache, numbness, weakness or ataxia or problems with walking or coordination,  change in mood or  memory.        No outpatient medications have been marked as taking for the 11/06/21 encounter (Appointment) with Nyoka Cowden, MD.                Objective:   Physical Exam  Wts   11/06/2021     *** 10/11/2020    145   06/12/2020      138 12/15/2019    135  03/11/2019      133 12/20/2018  130  11/08/2018    131  Wt 10/07/2011  126 vs 133  01/02/2012    Vital signs reviewed  11/06/2021  - Note at rest 02 sats  ***% on ***   General appearance:    ***       Labs ordered 10/11/2020  :  did not go to lab as requested                      Assessment & Plan:

## 2022-05-29 ENCOUNTER — Ambulatory Visit (INDEPENDENT_AMBULATORY_CARE_PROVIDER_SITE_OTHER): Payer: Medicaid Other | Admitting: Pulmonary Disease

## 2022-05-29 ENCOUNTER — Telehealth: Payer: Self-pay | Admitting: Pulmonary Disease

## 2022-05-29 ENCOUNTER — Encounter: Payer: Self-pay | Admitting: Pulmonary Disease

## 2022-05-29 VITALS — BP 112/70 | HR 61 | Ht 62.5 in | Wt 151.2 lb

## 2022-05-29 DIAGNOSIS — D869 Sarcoidosis, unspecified: Secondary | ICD-10-CM

## 2022-05-29 MED ORDER — HYDROXYCHLOROQUINE SULFATE 200 MG PO TABS: 400.0000 mg | ORAL_TABLET | Freq: Every day | ORAL | 1 refills | Status: DC

## 2022-05-29 MED ORDER — PREDNISONE 10 MG PO TABS: ORAL_TABLET | ORAL | 0 refills | Status: DC

## 2022-05-29 NOTE — Telephone Encounter (Signed)
[facility] says no direction given on last RX sent to them for Pred today. Please resend correctly. TY

## 2022-05-29 NOTE — Patient Instructions (Signed)
I have sent in a program for prednisone 30 mg daily for 30 days  I did refill your hydroxychloroquine  We can schedule an appointment with Dr. Sherene Sires in the next 3 to 4 weeks for you can see him if okay with you  I will encourage that you set up an appointment with dermatology  Call with significant concerns

## 2022-05-29 NOTE — Progress Notes (Signed)
Katelyn Stevens    [id number]    Sep 27, 1980  Primary Care Physician:Patient, No Pcp Per  Referring Physician: No referring provider defined for this encounter.  Chief complaint:    Patient being seen for an acute visit   HPI:  Skin rash Raised non-itchy rash on back -This is relating to sarcoidosis affecting the skin  Usually on hydroxychloroquine,  Uses prednisone as needed  Was diagnosed in 2013 Was on prednisone 20 13-20 15 according to records  Outside exacerbations here and there  Denies any respiratory complaints at present  Pets: Occupation: Exposures: Smoking history: Travel history: Relevant family history:  Outpatient Encounter Medications as of 05/29/2022  Medication Sig   hydroxychloroquine (PLAQUENIL) 200 MG tablet Take 2 tablets (400 mg total) by mouth daily.   predniSONE (DELTASONE) 10 MG tablet TAKE 2 TABLETS BY MOUTH EVERY DAY WITH BREAKFAST UNTIL BETTER, THEN 1 EVERY DAY FOR 2 WEEKS, THEN 1/2 DAILY   triamcinolone (NASACORT) 55 MCG/ACT AERO nasal inhaler Place 2 sprays into the nose daily as needed.   triamcinolone cream (KENALOG) 0.1 % Apply bid prn   No facility-administered encounter medications on file as of 05/29/2022.    Allergies as of 05/29/2022 - Review Complete 04/30/2021  Allergen Reaction Noted   Aspirin Other (See Comments)     Past Medical History:  Diagnosis Date   Sarcoidosis     Past Surgical History:  Procedure Laterality Date   uterine didelphis      Family History  Problem Relation Age of Onset   Breast cancer Mother    Liver cancer Maternal Grandmother     Social History   Socioeconomic History   Marital status: Single    Spouse name: Not on file   Number of children: 1   Years of education: Not on file   Highest education level: Not on file  Occupational History   Occupation: Teaching laboratory technician: [employer]  Tobacco Use   Smoking status: Former    Packs/day: 0.50    Years: 6.00     Additional pack years: 0.00    Total pack years: 3.00    Types: Cigarettes    Quit date: 08/07/2010    Years since quitting: 11.8   Smokeless tobacco: Never  Vaping Use   Vaping Use: Never used  Substance and Sexual Activity   Alcohol use: Yes    Alcohol/week: 3.0 standard drinks of alcohol    Types: 3 Shots of liquor per week    Comment: 2 glasses a week    Drug use: No   Sexual activity: Yes    Birth control/protection: Pill  Other Topics Concern   Not on file  Social History Narrative   Not on file   Social Determinants of Health   Financial Resource Strain: Not on file  Food Insecurity: Not on file  Transportation Needs: Not on file  Physical Activity: Not on file  Stress: Not on file  Social Connections: Not on file  Intimate Partner Violence: Not on file    Review of Systems  Constitutional:  Negative for fatigue.  Cardiovascular:  Negative for chest pain.  Skin:  Positive for rash.    There were no vitals filed for this visit.   Physical Exam HENT:     Head: Normocephalic and atraumatic.     Mouth/Throat:     Mouth: Mucous membranes are moist.  Eyes:     Pupils: Pupils are equal, round,  and reactive to light.  Cardiovascular:     Rate and Rhythm: Normal rate and regular rhythm.     Heart sounds: No murmur heard.    No friction rub.  Pulmonary:     Effort: No respiratory distress.     Breath sounds: No stridor. No wheezing, rhonchi or rales.  Musculoskeletal:     Cervical back: No rigidity or tenderness.  Skin:    Findings: Lesion and rash present.  Neurological:     Mental Status: She is alert.  Psychiatric:        Mood and Affect: Mood normal.    Data Reviewed:   Assessment:  Sarcoidosis with skin rash mostly affecting the back  No respiratory complaints  Plan/Recommendations: Prescription for hydroxychloroquine refilled  Prescription for prednisone 30 mg  Patient stated Dr. Sherene Sires is always managed exacerbations -We can make an  appointment for her to see Dr. Lindie Spruce within the next 3 to 4 weeks  I did encourage her to make an appointment with dermatology for the management of sarcoidosis affecting the skin     Virl Diamond MD [facility] 05/29/2022, 1:15 PM  CC: No ref. provider found

## 2022-05-30 MED ORDER — PREDNISONE 10 MG PO TABS: ORAL_TABLET | ORAL | 0 refills | Status: DC

## 2022-05-30 NOTE — Telephone Encounter (Signed)
With Dr. Val Eagle not in the office today, sending this to Dr. Sherene Sires for him to figure out instructions of pt's prednisone Rx.  Only thing we are showing from Dr.O is that 90 tabs was to be dispensed but the admin instructions is blank.

## 2022-05-30 NOTE — Telephone Encounter (Signed)
New Rx sent to pharmacy for pt. Nothing further needed.

## 2022-07-05 ENCOUNTER — Other Ambulatory Visit: Payer: Self-pay | Admitting: Pulmonary Disease

## 2022-07-17 NOTE — Progress Notes (Unsigned)
Subjective:      Patient ID: Katelyn Stevens, female    DOB: [birth year]   MRN: [id number]    Brief patient profile:  46   yobf quit smoking 2012 then starting November 2012 onset of nasal congestion then cough and sob in winter of 2013 and then lymph node swelling spring 2013 and referred 08/21/2011 to pulmonary clinic by her brother with strong fm hx of sarcoid with skin bx c/w sarcoid 09/2018    History of Present Illness   08/21/2011 1st pulmonary eval cc can't breath through nose and green nasal drainage, much better p prednisone x 2 weeks, much worse off it.  Min arthralgias, min sob better on prednisone, no occular complaints or rash. rec augmentin x 10 days one twice daily (yogurt for lunch) Nasal saline irrigation and stop sinex unless you have to one side to sleep x 5 days only Prednisone 10 mg x 2 each until better then one each am Please schedule a follow up office visit in 6 weeks, call sooner if needed with cxr. I do recommend you see an MD eye doctor for a sarcoid eye exam > did not go    10/11/2020  f/u ov/Darlys Buis re: sarcoid maint on plaquenil 400 mg per day and pred 20 x UB > taper down one half qod  - last pred 20 was end of July 2022   Chief Complaint  Patient presents with   Follow-up    Patient states that she is having a sarcoid flare up, started about 3 days ago.   Dyspnea:  no sob  Cough: some nasal congestion, white thick Sleeping: no resp cc  SABA use: inhalers 02: noe  Covid status:   never vaccinated  Rec No change rx   Flared on plaquenil 200 mg every other day and no prednisone x > one  month   07/18/2022  f/u ov/Neila Teem re: sarcoid with rhinitis / rash maint on plaquenil  200 mg one bid and prednisone back to 0  Chief Complaint  Patient presents with   Follow-up    States improved since last OV, feeling nasal congestion, increased mucus   Dyspnea:  not limited by sob even treadmill Cough: little bit of pnd Sleeping: no resp cc  SABA use: none      No  obvious day to day or daytime variability or assoc excess/ purulent sputum or mucus plugs or hemoptysis or cp or chest tightness, subjective wheeze or overt   hb symptoms.    Also denies any obvious fluctuation of symptoms with weather or environmental changes or other aggravating or alleviating factors except as outlined above   No unusual exposure hx or h/o childhood pna/ asthma or knowledge of premature birth.  Current Allergies, Complete Past Medical History, Past Surgical History, Family History, and Social History were reviewed in [employer] record.  ROS  The following are not active complaints unless bolded Hoarseness, sore throat, dysphagia, dental problems, itching, sneezing,  nasal congestion or discharge of excess mucus or purulent secretions, ear ache,   fever, chills, sweats, unintended wt loss or wt gain, classically pleuritic or exertional cp,  orthopnea pnd or arm/hand swelling  or leg swelling, presyncope, palpitations, abdominal pain, anorexia, nausea, vomiting, diarrhea  or change in bowel habits or change in bladder habits, change in stools or change in urine, dysuria, hematuria,  rash, arthralgias, visual complaints, headache, numbness, weakness or ataxia or problems with walking or coordination,  change in mood or  memory.  Current Meds  Medication Sig   hydroxychloroquine (PLAQUENIL) 200 MG tablet TAKE 2 TABLETS(400 MG) BY MOUTH DAILY   predniSONE (DELTASONE) 10 MG tablet TAKE 2 TABLETS BY MOUTH EVERY DAY WITH BREAKFAST UNTIL BETTER, THEN 1 EVERY DAY                     Objective:   Physical Exam  Wts  07/18/2022    154  10/11/2020    145   06/12/2020      138 12/15/2019    135  03/11/2019      133 12/20/2018  130  11/08/2018    131  Wt 10/07/2011  126 vs 133  01/02/2012     Vital signs reviewed  07/18/2022  - Note at rest 02 sats  100% on RA   General appearance:    amb bf nad       HEENT : Oropharynx  clear      NECK :   without  apparent JVD/ palpable Nodes/TM    LUNGS: no acc muscle use,  Nl contour chest which is clear to A and P bilaterally without cough on insp or exp maneuvers   CV:  RRR  no s3 or murmur or increase in P2, and no edema   ABD:  soft and nontender with nl inspiratory excursion in the supine position. No bruits or organomegaly appreciated   MS:  Nl gait/ ext warm without deformities Or obvious joint restrictions  calf tenderness, cyanosis or clubbing    SKIN: warm and dry with mild MP rash low back improved since restarted meds per pt   NEURO:  alert, approp, nl sensorium with  no motor or cerebellar deficits apparent.       Labs ordered/ reviewed:      Chemistry      Component Value Date/Time   NA 138 07/18/2022 1102   K 4.1 07/18/2022 1102   CL 105 07/18/2022 1102   CO2 27 07/18/2022 1102   BUN 10 07/18/2022 1102   CREATININE 0.87 07/18/2022 1102      Component Value Date/Time   CALCIUM 9.3 07/18/2022 1102   ALKPHOS 45 07/18/2022 1102   AST 19 07/18/2022 1102   ALT 13 07/18/2022 1102   BILITOT 0.9 07/18/2022 1102        Lab Results  Component Value Date   WBC 7.3 07/18/2022   HGB 13.2 07/18/2022   HCT 39.0 07/18/2022   MCV 83.3 07/18/2022   PLT 301.0 07/18/2022          Lab Results  Component Value Date   ESRSEDRATE 1 07/18/2022   ESRSEDRATE 1 03/11/2019   ESRSEDRATE 2 09/27/2018                           Assessment & Plan:

## 2022-07-18 ENCOUNTER — Ambulatory Visit (INDEPENDENT_AMBULATORY_CARE_PROVIDER_SITE_OTHER): Payer: 59 | Admitting: Internal Medicine

## 2022-07-18 ENCOUNTER — Encounter: Payer: Self-pay | Admitting: Internal Medicine

## 2022-07-18 VITALS — BP 108/70 | HR 67 | Temp 98.5°F | Ht 62.5 in | Wt 154.8 lb

## 2022-07-18 DIAGNOSIS — D869 Sarcoidosis, unspecified: Secondary | ICD-10-CM | POA: Diagnosis not present

## 2022-07-18 LAB — CBC WITH DIFFERENTIAL/PLATELET
Basophils Absolute: 0.1 10*3/uL (ref 0.0–0.1)
Basophils Relative: 1.3 % (ref 0.0–3.0)
Eosinophils Absolute: 0.1 10*3/uL (ref 0.0–0.7)
Eosinophils Relative: 1.4 % (ref 0.0–5.0)
HCT: 39 % (ref 36.0–46.0)
Hemoglobin: 13.2 g/dL (ref 12.0–15.0)
Lymphocytes Relative: 13.9 % (ref 12.0–46.0)
Lymphs Abs: 1 10*3/uL (ref 0.7–4.0)
MCHC: 33.7 g/dL (ref 30.0–36.0)
MCV: 83.3 fl (ref 78.0–100.0)
Monocytes Absolute: 0.7 10*3/uL (ref 0.1–1.0)
Monocytes Relative: 9.7 % (ref 3.0–12.0)
Neutro Abs: 5.4 10*3/uL (ref 1.4–7.7)
Neutrophils Relative %: 73.7 % (ref 43.0–77.0)
Platelets: 301 10*3/uL (ref 150.0–400.0)
RBC: 4.68 Mil/uL (ref 3.87–5.11)
RDW: 13.8 % (ref 11.5–15.5)
WBC: 7.3 10*3/uL (ref 4.0–10.5)

## 2022-07-18 LAB — BASIC METABOLIC PANEL
BUN: 10 mg/dL (ref 6–23)
CO2: 27 mEq/L (ref 19–32)
Calcium: 9.3 mg/dL (ref 8.4–10.5)
Chloride: 105 mEq/L (ref 96–112)
Creatinine, Ser: 0.87 mg/dL (ref 0.40–1.20)
GFR: 81.3 mL/min (ref >60.00)
Glucose, Bld: 82 mg/dL (ref 70–99)
Potassium: 4.1 mEq/L (ref 3.5–5.1)
Sodium: 138 mEq/L (ref 135–145)

## 2022-07-18 LAB — HEPATIC FUNCTION PANEL
ALT: 13 U/L (ref 0–35)
AST: 19 U/L (ref 0–37)
Albumin: 3.9 g/dL (ref 3.5–5.2)
Alkaline Phosphatase: 45 U/L (ref 39–117)
Bilirubin, Direct: 0.2 mg/dL (ref 0.0–0.3)
Total Bilirubin: 0.9 mg/dL (ref 0.2–1.2)
Total Protein: 6.6 g/dL (ref 6.0–8.3)

## 2022-07-18 LAB — SEDIMENTATION RATE: Sed Rate: 1 mm/hr (ref 0–20)

## 2022-07-18 NOTE — Assessment & Plan Note (Signed)
Clinical dx only with symptoms starting ? 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    - Angiotensin 09/27/2018  = 141 prior to any rx     - Plaquenil 400 mg daily x 09/30/2018     - taper off prednisone by end of Dec 2020 > skin flared w/in a month as did nasal symptoms     - ACE  03/11/19 =  40 on plaquenil 400/ pred 5 mg daily     - Skin eruption 04/30/2021 on 5mg  every other day   Flares when not compliant with plaquenil with pred taper as a back up / advised at risk of other organ involvement but so far skin/ nasal symptoms are the only manifestations other than mild RN ILD s doe  F/u can therefore be q 6 m sooner prn with option of 2nd opinion from Dr Everardo All in meantim if needed/ pt advised          Each maintenance medication was reviewed in detail including emphasizing most importantly the difference between maintenance and prns and under what circumstances the prns are to be triggered using an action plan format where appropriate.  Total time for H and P, chart review, counseling,   and generating customized AVS unique to this office visit / same day charting = 20 min

## 2022-07-18 NOTE — Patient Instructions (Signed)
Plaquenil 200 mg one twice daily   Predisone 20 mg per day until better then taper off   Please schedule a follow up visit in 6  months but call sooner if needed

## 2022-07-19 ENCOUNTER — Encounter: Payer: Self-pay | Admitting: Internal Medicine

## 2022-07-21 LAB — ANGIOTENSIN CONVERTING ENZYME: Angiotensin-Converting Enzyme: 101 U/L — ABNORMAL HIGH (ref 9–67)

## 2022-12-15 ENCOUNTER — Telehealth: Payer: Self-pay | Admitting: Internal Medicine

## 2022-12-15 NOTE — Telephone Encounter (Signed)
[facility] on [address]  Pred and Plaquenil refilled. (She tried to get a sooner FU appt but nothing avail for Dr. Sherene Sires in [facility] so she agreed to drive to [place] in order to see him. Adv we would see about a courtesy refill) Her # is [phone]

## 2022-12-18 MED ORDER — PREDNISONE 10 MG PO TABS: ORAL_TABLET | ORAL | 0 refills | Status: DC

## 2022-12-18 NOTE — Telephone Encounter (Signed)
Per LOV 07/18/22: Instructions  Plaquenil 200 mg one twice daily    Predisone 20 mg per day until better then taper off     Per [facility] patient last filled Plaquenil 06/08/22 #60 and has not filled since. Refills currently available.   Spoke with patient and she reports she uses prednisone when having a flare, and does not take the Plaquenil on a regular basis because it "messes with my stomach".  Patient advised refills of Plaquenil are available at pharmacy, prednisone sent.

## 2022-12-18 NOTE — Telephone Encounter (Signed)
Patient is calling again regarding her medication refills----she is unclear as to why these have not been called in---patient  has scheduled a follow up with Dr. Wynell Balloon  call back call back  [phone]

## 2022-12-18 NOTE — Telephone Encounter (Signed)
Pt calling to check on her medications

## 2022-12-29 ENCOUNTER — Encounter: Payer: Self-pay | Admitting: Internal Medicine

## 2022-12-29 ENCOUNTER — Telehealth: Payer: Self-pay | Admitting: Internal Medicine

## 2022-12-29 NOTE — Telephone Encounter (Signed)
Spoke with patient regarding new appointment date and time---Monday 02/09/23 at 11:15 am in the [place] office.  Patient voiced her understanding.

## 2023-01-16 ENCOUNTER — Ambulatory Visit: Payer: 59 | Admitting: Internal Medicine

## 2023-02-03 ENCOUNTER — Ambulatory Visit: Payer: 59 | Admitting: Internal Medicine

## 2023-02-04 ENCOUNTER — Ambulatory Visit: Payer: 59 | Admitting: Internal Medicine

## 2023-02-08 NOTE — Progress Notes (Deleted)
 Subjective:      Patient ID: Katelyn Stevens, female    DOB: [birth year]   MRN: [id number]    Brief patient profile:  42   yobf quit smoking 2012 then starting November 2012 onset of nasal congestion then cough and sob in winter of 2013 and then lymph node swelling spring 2013 and referred 08/21/2011 to pulmonary clinic by her brother with strong fm hx of sarcoid with skin bx c/w sarcoid 09/2018    History of Present Illness   08/21/2011 1st pulmonary eval cc can't breath through nose and green nasal drainage, much better p prednisone x 2 weeks, much worse off it.  Min arthralgias, min sob better on prednisone, no occular complaints or rash. rec augmentin x 10 days one twice daily (yogurt for lunch) Nasal saline irrigation and stop sinex unless you have to one side to sleep x 5 days only Prednisone 10 mg x 2 each until better then one each am Please schedule a follow up Stevens visit in 6 weeks, call sooner if needed with cxr. I do recommend you see an MD eye doctor for a sarcoid eye exam > did not go    10/11/2020  f/u ov/Katelyn Stevens re: sarcoid maint on plaquenil 400 mg per day and pred 20 x UB > taper down one half qod  - last pred 20 was end of July 2022   Chief Complaint  Patient presents with   Follow-up    Patient states that she is having a sarcoid flare up, started about 3 days ago.   Dyspnea:  no sob  Cough: some nasal congestion, white thick Sleeping: no resp cc  SABA use: inhalers 02: noe  Covid status:   never vaccinated  Rec No change rx   Flared on plaquenil 200 mg every other day and no prednisone x > one  month    07/18/2022  f/u ov/Katelyn Stevens re: sarcoid with rhinitis / rash maint on plaquenil  200 mg one bid and prednisone back to 0  Chief Complaint  Patient presents with   Follow-up    States improved since last OV, feeling nasal congestion, increased mucus   Dyspnea:  not limited by sob even treadmill Cough: little bit of pnd Sleeping: no resp cc  SABA use: none   Rec Plaquenil 200 mg one twice daily  Predisone 20 mg per day until better then taper off Please schedule a follow up visit in 6  months but call sooner if needed     02/09/2023  f/u ov/Katelyn Stevens/Katelyn Stevens re: sarcoidosis with rash and rhinitis  maint on ***  No chief complaint on file.   Dyspnea:  *** Cough: *** Sleeping: ***   resp cc  SABA use: *** 02: ***  Lung cancer screening: ***   No obvious day to day or daytime variability or assoc excess/ purulent sputum or mucus plugs or hemoptysis or cp or chest tightness, subjective wheeze or overt sinus or hb symptoms.    Also denies any obvious fluctuation of symptoms with weather or environmental changes or other aggravating or alleviating factors except as outlined above   No unusual exposure hx or h/o childhood pna/ asthma or knowledge of premature birth.  Current Allergies, Complete Past Medical History, Past Surgical History, Family History, and Social History were reviewed in [employer] record.  ROS  The following are not active complaints unless bolded Hoarseness, sore throat, dysphagia, dental problems, itching, sneezing,  nasal congestion or discharge of excess mucus or  purulent secretions, ear ache,   fever, chills, sweats, unintended wt loss or wt gain, classically pleuritic or exertional cp,  orthopnea pnd or arm/hand swelling  or leg swelling, presyncope, palpitations, abdominal pain, anorexia, nausea, vomiting, diarrhea  or change in bowel habits or change in bladder habits, change in stools or change in urine, dysuria, hematuria,  rash, arthralgias, visual complaints, headache, numbness, weakness or ataxia or problems with walking or coordination,  change in mood or  memory.        No outpatient medications have been marked as taking for the 02/09/23 encounter (Appointment) with Nyoka Cowden, MD.                   Objective:   Physical Exam  Wts  02/09/2023        ***  07/18/2022     154  10/11/2020    145   06/12/2020      138 12/15/2019    135  03/11/2019      133 12/20/2018  130  11/08/2018    131  Wt 10/07/2011  126 vs 133  01/02/2012    Vital signs reviewed  02/09/2023  - Note at rest 02 sats  ***% on ***   General appearance:    ***      mild MP rash low back improved since restarted meds per pt ***                              Assessment & Plan:

## 2023-02-09 ENCOUNTER — Ambulatory Visit: Payer: Medicaid Other | Admitting: Internal Medicine

## 2023-02-09 ENCOUNTER — Encounter: Payer: Self-pay | Admitting: Internal Medicine

## 2023-03-24 NOTE — Progress Notes (Signed)
 Patient ID: Katelyn Stevens, female    DOB: 1980-11-15   MRN: [id number]    Brief patient profile:  42   yobf quit smoking 2012 then starting November 2012 onset of nasal congestion then cough and sob in winter of 2013 and then lymph node swelling spring 2013 and referred 08/21/2011 to pulmonary clinic by her brother with strong fm hx of sarcoid with skin bx c/w sarcoid 09/2018    History of Present Illness   08/21/2011 1st pulmonary eval cc can't breath through nose and green nasal drainage, much better p prednisone x 2 weeks, much worse off it.  Min arthralgias, min sob better on prednisone, no occular complaints or rash. rec augmentin x 10 days one twice daily (yogurt for lunch) Nasal saline irrigation and stop sinex unless you have to one side to sleep x 5 days only Prednisone 10 mg x 2 each until better then one each am Please schedule a follow up office visit in 6 weeks, call sooner if needed with cxr. I do recommend you see an MD eye doctor for a sarcoid eye exam > did not go    10/11/2020  f/u ov/Jaray Boliver re: sarcoid maint on plaquenil 400 mg per day and pred 20 x UB > taper down one half qod  - last pred 20 was end of July 2022   Chief Complaint  Patient presents with   Follow-up    Patient states that she is having a sarcoid flare up, started about 3 days ago.   Dyspnea:  no sob  Cough: some nasal congestion, white thick Sleeping: no resp cc  SABA use: inhalers 02: noe  Covid status:   never vaccinated  Rec No change rx   Flared on plaquenil 200 mg every other day and no prednisone x > one  month   07/18/2022  f/u ov/Janece Laidlaw re: sarcoid with rhinitis / rash maint on plaquenil  200 mg one bid and prednisone back to 0  Chief Complaint  Patient presents with   Follow-up    States improved since last OV, feeling nasal congestion, increased mucus   Dyspnea:  not limited by sob even treadmill Cough: little bit of pnd Sleeping: no resp cc  SABA use: none  Rec Plaquenil 200 mg  one twice daily  Predisone 20 mg per day until better then taper off   03/25/2023  f/u ov/Albertha Beattie re: sarcoid maint on plaqenil  200 mg once daily  last pred x sev weeks  Chief Complaint  Patient presents with   Follow-up   Dyspnea:  treadmill fine  Cough: none  Sleeping: flat bed / one pillow s resp cc  SABA use: none  02: none    No obvious day to day or daytime variability or assoc excess/ purulent sputum or mucus plugs or hemoptysis or cp or chest tightness, subjective wheeze or overt sinus or hb symptoms.    Also denies any obvious fluctuation of symptoms with weather or environmental changes or other aggravating or alleviating factors except as outlined above   No unusual exposure hx or h/o childhood pna/ asthma or knowledge of premature birth.  Current Allergies, Complete Past Medical History, Past Surgical History, Family History, and Social History were reviewed in [employer] record.  ROS  The following are not active complaints unless bolded Hoarseness, sore throat, dysphagia, dental problems, itching, sneezing,  nasal congestion or discharge of excess mucus or purulent secretions, ear ache,   fever, chills,  sweats, unintended wt loss or wt gain, classically pleuritic or exertional cp,  orthopnea pnd or arm/hand swelling  or leg swelling, presyncope, palpitations, abdominal pain, anorexia, nausea, vomiting, diarrhea  or change in bowel habits or change in bladder habits, change in stools or change in urine, dysuria, hematuria,  rash, arthralgias, visual complaints, headache, numbness, weakness or ataxia or problems with walking or coordination,  change in mood or  memory.        Current Meds  Medication Sig   hydroxychloroquine (PLAQUENIL) 200 MG tablet TAKE 2 TABLETS(400 MG) BY MOUTH DAILY                        Objective:   Physical Exam  Wts  03/25/2023    157  07/18/2022    154  10/11/2020    145   06/12/2020      138 12/15/2019    135   03/11/2019      133 12/20/2018  130  11/08/2018    131  Wt 10/07/2011  126 vs 133  01/02/2012     Vital signs reviewed  03/25/2023  - Note at rest 02 sats  93% on RA   General appearance:    amb bf nad   HEENT : Oropharynx  clear         NECK :  without  apparent JVD/ palpable Nodes/TM    LUNGS: no acc muscle use,  Nl contour chest which is clear to A and P bilaterally without cough on insp or exp maneuvers   CV:  RRR  no s3 or murmur or increase in P2, and no edema   ABD:  soft and nontender   MS:  Gait nl   ext warm without deformities Or obvious joint restrictions  calf tenderness, cyanosis or clubbing    SKIN:  mild MP rash low back  no change baseline reported per pt    NEURO:  alert, approp, nl sensorium with  no motor or cerebellar deficits apparent.       Labs ordered/ reviewed:      Chemistry      Component Value Date/Time   NA 137 03/25/2023 1603   K 4.2 03/25/2023 1603   CL 104 03/25/2023 1603   CO2 27 03/25/2023 1603   BUN 8 03/25/2023 1603   CREATININE 0.87 03/25/2023 1603      Component Value Date/Time   CALCIUM 9.2 03/25/2023 1603   ALKPHOS 46 03/25/2023 1603   AST 19 03/25/2023 1603   ALT 10 03/25/2023 1603   BILITOT 0.5 03/25/2023 1603        Lab Results  Component Value Date   WBC 5.7 03/25/2023   HGB 13.1 03/25/2023   HCT 38.4 03/25/2023   MCV 82.8 03/25/2023   PLT 393.0 03/25/2023          Lab Results  Component Value Date   ESRSEDRATE 6 03/25/2023   ESRSEDRATE 1 07/18/2022   ESRSEDRATE 1 03/11/2019         ACE level 03/25/2023     =   102  (no change from prior)            Assessment & Plan:

## 2023-03-25 ENCOUNTER — Ambulatory Visit: Payer: Medicaid Other | Admitting: Internal Medicine

## 2023-03-25 ENCOUNTER — Encounter: Payer: Self-pay | Admitting: Internal Medicine

## 2023-03-25 VITALS — BP 110/66 | HR 73 | Temp 98.4°F | Ht 62.5 in | Wt 157.8 lb

## 2023-03-25 DIAGNOSIS — D869 Sarcoidosis, unspecified: Secondary | ICD-10-CM | POA: Diagnosis not present

## 2023-03-25 DIAGNOSIS — Z87891 Personal history of nicotine dependence: Secondary | ICD-10-CM

## 2023-03-25 NOTE — Patient Instructions (Signed)
Please remember to go to the lab department   for your tests - we will call you with the results when they are available.      Please schedule a follow up visit in 12  months but call sooner if needed  

## 2023-03-26 LAB — CBC WITH DIFFERENTIAL/PLATELET
Basophils Absolute: 0.1 10*3/uL (ref 0.0–0.1)
Basophils Relative: 0.9 % (ref 0.0–3.0)
Eosinophils Absolute: 0.1 10*3/uL (ref 0.0–0.7)
Eosinophils Relative: 1.9 % (ref 0.0–5.0)
HCT: 38.4 % (ref 36.0–46.0)
Hemoglobin: 13.1 g/dL (ref 12.0–15.0)
Lymphocytes Relative: 19.4 % (ref 12.0–46.0)
Lymphs Abs: 1.1 10*3/uL (ref 0.7–4.0)
MCHC: 34.2 g/dL (ref 30.0–36.0)
MCV: 82.8 fl (ref 78.0–100.0)
Monocytes Absolute: 0.6 10*3/uL (ref 0.1–1.0)
Monocytes Relative: 10.2 % (ref 3.0–12.0)
Neutro Abs: 3.9 10*3/uL (ref 1.4–7.7)
Neutrophils Relative %: 67.6 % (ref 43.0–77.0)
Platelets: 393 10*3/uL (ref 150.0–400.0)
RBC: 4.63 Mil/uL (ref 3.87–5.11)
RDW: 13.3 % (ref 11.5–15.5)
WBC: 5.7 10*3/uL (ref 4.0–10.5)

## 2023-03-26 LAB — BASIC METABOLIC PANEL
BUN: 8 mg/dL (ref 6–23)
CO2: 27 meq/L (ref 19–32)
Calcium: 9.2 mg/dL (ref 8.4–10.5)
Chloride: 104 meq/L (ref 96–112)
Creatinine, Ser: 0.87 mg/dL (ref 0.40–1.20)
GFR: 80.9 mL/min (ref >60.00)
Glucose, Bld: 69 mg/dL — ABNORMAL LOW (ref 70–99)
Potassium: 4.2 meq/L (ref 3.5–5.1)
Sodium: 137 meq/L (ref 135–145)

## 2023-03-26 LAB — HEPATIC FUNCTION PANEL
ALT: 10 U/L (ref 0–35)
AST: 19 U/L (ref 0–37)
Albumin: 4 g/dL (ref 3.5–5.2)
Alkaline Phosphatase: 46 U/L (ref 39–117)
Bilirubin, Direct: 0.1 mg/dL (ref 0.0–0.3)
Total Bilirubin: 0.5 mg/dL (ref 0.2–1.2)
Total Protein: 6.7 g/dL (ref 6.0–8.3)

## 2023-03-26 LAB — SEDIMENTATION RATE: Sed Rate: 6 mm/h (ref 0–20)

## 2023-03-27 LAB — ANGIOTENSIN CONVERTING ENZYME: Angiotensin-Converting Enzyme: 102 U/L — ABNORMAL HIGH (ref 9–67)

## 2023-03-28 NOTE — Assessment & Plan Note (Signed)
 Clinical dx only with symptoms starting ? 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    - Angiotensin 09/27/2018  = 141 prior to any rx     - Plaquenil 400 mg daily x 09/30/2018     - taper off prednisone by end of Dec 2020 > skin flared w/in a month as did nasal symptoms     - ACE  03/11/19 =  40 on plaquenil 400/ pred 5 mg daily     - Skin eruption 04/30/2021 on 5mg  every other day   >>> 03/25/2023 can't tol plaquenil 400mg  q day due to nausea with ACE 102 and low grade sarcoid dermatitits so rec try 200 mg bid with meals and continue to titrate pred as low as possible and if can't tol the higher dose and /or consistently  lower the pred back to  less than or = to 5 mg daily consider referral to Dr Everardo All for a second opinion          Each maintenance medication was reviewed in detail including emphasizing most importantly the difference between maintenance and prns and under what circumstances the prns are to be triggered using an action plan format where appropriate.  Total time for H and P, chart review, counseling,  and generating customized AVS unique to this office visit / same day charting = 20 min

## 2023-03-30 NOTE — Progress Notes (Signed)
 Spoke with pt and notified of results per Dr. Sherene Sires. Pt verbalized understanding and denied any questions.She prefers to keep seeing Dr Sherene Sires.

## 2023-04-27 ENCOUNTER — Other Ambulatory Visit: Payer: Self-pay | Admitting: Internal Medicine

## 2023-04-27 ENCOUNTER — Ambulatory Visit: Payer: Self-pay

## 2023-04-27 MED ORDER — PREDNISONE 10 MG PO TABS: ORAL_TABLET | ORAL | 0 refills | Status: DC

## 2023-04-27 NOTE — Telephone Encounter (Signed)
 Copied from CRM [id number]. Topic: Clinical - Medication Refill >> Apr 27, 2023 10:53 AM Evie Hoff wrote: Most Recent Primary Care Visit:   Medication: predniSONE  (DELTASONE ) 10 MG tablet  Has the patient contacted their pharmacy? No generally calls doctor for it  (Agent: If no, request that the patient contact the pharmacy for the refill. If patient does not wish to contact the pharmacy document the reason why and proceed with request.) (Agent: If yes, when and what did the pharmacy advise?)  Is this the correct pharmacy for this prescription? Yes If no, delete pharmacy and type the correct one.  This is the patient's preferred pharmacy:  [id number] [place], [place] - [phone] AT [facility] OF [facility] & [address] [place] [address] Phone: [phone] Fax: [phone]   Has the prescription been filled recently? No  Is the patient out of the medication? Yes  Has the patient been seen for an appointment in the last year OR does the patient have an upcoming appointment? Yes  Can we respond through MyChart? No  Agent: Please be advised that Rx refills may take up to 3 business days. We ask that you follow-up with your pharmacy.

## 2023-04-27 NOTE — Telephone Encounter (Signed)
 Copied from CRM [id number]. Topic: Clinical - Red Word Triage >> Apr 27, 2023 10:56 AM Evie Hoff wrote: [facility] that prompted transfer to Nurse Triage: patient iis requesting to speak with a nurse cause they generally want to know why patient is needing the predniSONE  (DELTASONE ) 10 MG tablet, patient says she is having a flare up .I did send the refill request e2c2 pulmonary too   Chief Complaint: Sarcoidosis flare Symptoms: Lesions to back and abdomen  Frequency: Constant  Pertinent Negatives: Patient denies fever, pain to lesions, shortness of breath  Disposition: [] ED /[] Urgent Care (no appt availability in office) / [] Appointment(In office/virtual)/ []  [place] [facility]/ [] Home Care/ [] Refused Recommended Disposition /[] [facility]/ [x]  Follow-up with PCP Additional Notes: Patient called in requesting a refill for her Prednisone  for her Sarcoidosis. She states that she began to develop lesions to her back and abdomen 2 days ago. She states the lesions are red and raised, but denies any pain. Patient denies any other symptoms at this time. Refill request sent for the patient.      Reason for Disposition  Looks like a boil, infected sore, or deep ulcer    Patient reports consistent with history of Sarcoidosis  Answer Assessment - Initial Assessment Questions 1. APPEARANCE of LESION: "What does it look like?"      Red and raised  2. SIZE: "How big is it?" (e.g., inches, cm; or compare to size of pinhead, tip of pen, eraser, coin, pea, grape, ping pong ball)      Different sizes, tip of pinky to roundness of thumb  3. COLOR: "What color is it?" "Is there more than one color?"     Red 4. SHAPE: "What shape is it?" (e.g., round, irregular)     Different shapes  5. RAISED: "Does it stick up above the skin or is it flat?" (e.g., raised or elevated)     Yes 6. TENDER: "Does it hurt when you touch it?"  (Scale 1-10; or mild, moderate, severe)     No 7. LOCATION: "Where is it  located?"      Back and stomach  8. ONSET: "When did it first appear?"      2 days ago 9. NUMBER: "Is there just one?" or "Are there others?"     Multiple  10. CAUSE: "What do you think it is?"       Sarcoidosis  11. OTHER SYMPTOMS: "Do you have any other symptoms?" (e.g., fever)       No  Protocols used: Skin Lesion - Moles or Growths-A-AH

## 2023-09-02 ENCOUNTER — Telehealth: Payer: Self-pay | Admitting: *Deleted

## 2023-09-02 MED ORDER — PREDNISONE 10 MG PO TABS: ORAL_TABLET | ORAL | 0 refills | Status: DC

## 2023-09-02 NOTE — Telephone Encounter (Signed)
 I spoke with Dr. Darlean- ok to refill the pred as given last time- 100 tablets   Pt aware, I have sent rx and she was notified to keep pending ov next wk.   Nothing further needed

## 2023-09-02 NOTE — Telephone Encounter (Signed)
 Copied from CRM #[id number]. Topic: Clinical - Prescription Issue >> Sep 02, 2023  9:39 AM Corean SAUNDERS wrote: Reason for CRM: Patient is requesting an emergency fill on predniSONE  (DELTASONE ) 10 MG tablet as she is having a sarcoidosis flare up - please send to [id number] [address], [place] - [address] AT [facility] [address] [place] [address] Phone: [phone] Fax: [phone]  Patient is scheduled for an acute visit with Dr. Darlean on 9/3 but is requesting to be seen sooner.

## 2023-09-03 ENCOUNTER — Other Ambulatory Visit: Payer: Self-pay | Admitting: Pulmonary Disease

## 2023-09-07 NOTE — Progress Notes (Signed)
 Patient ID: Katelyn Stevens, female    DOB: 03-20-1980   MRN: [id number]   Brief patient profile:  43  yobf quit smoking 2012 then starting November 2012 onset of nasal congestion then cough and sob in winter of 2013 and then lymph node swelling spring 2013 and referred 08/21/2011 to pulmonary clinic by her brother with strong fm hx of sarcoid with skin bx c/w sarcoid 09/2018    History of Present Illness   08/21/2011 1st pulmonary eval cc can't breath through nose and green nasal drainage, much better p prednisone  x 2 weeks, much worse off it.  Min arthralgias, min sob better on prednisone , no occular complaints or rash. rec augmentin  x 10 days one twice daily (yogurt for lunch) Nasal saline irrigation and stop sinex unless you have to one side to sleep x 5 days only Prednisone  10 mg x 2 each until better then one each am Please schedule a follow up office visit in 6 weeks, call sooner if needed with cxr. I do recommend you see an MD eye doctor for a sarcoid eye exam > did not go    10/11/2020  f/u ov/Rae Plotner re: sarcoid maint on plaquenil  400 mg per day and pred 20 x UB > taper down one half qod  - last pred 20 was end of July 2022   Chief Complaint  Patient presents with   Follow-up    Patient states that she is having a sarcoid flare up, started about 3 days ago.   Dyspnea:  no sob  Cough: some nasal congestion, white thick Sleeping: no resp cc  SABA use: inhalers 02: noe  Covid status:   never vaccinated  Rec No change rx   Flared on plaquenil  200 mg every other day and no prednisone  x > one  month   07/18/2022  f/u ov/Lorrayne Ismael re: sarcoid with rhinitis / rash maint on plaquenil   200 mg one bid and prednisone  back to 0  Chief Complaint  Patient presents with   Follow-up    States improved since last OV, feeling nasal congestion, increased mucus   Dyspnea:  not limited by sob even treadmill Cough: little bit of pnd Sleeping: no resp cc  SABA use: none  Rec Plaquenil  200 mg  one twice daily  Predisone 20 mg per day until better then taper off   03/25/2023  f/u ov/Phenix Grein re: sarcoid maint on plaqenil  200 mg once daily  last pred x sev weeks  Chief Complaint  Patient presents with   Follow-up   Dyspnea:  treadmill fine  Cough: none  Sleeping: flat bed / one pillow s resp cc  SABA use: none  02: none Rec Please remember to go to the lab department   ok calcium, no change ACE Please schedule a follow up visit in 12  months but call sooner if needed    09/09/2023  ACUTE  ov/Ricky Doan re: she noted asymptomatic rash worse x 5d    prior to OV on just plaquenil  200 mg so   restarted prednisone     4 d prior to OV   = 20mg  per day at time of ov Chief Complaint  Patient presents with   Sarcoidosis    Sarcoidosis flare on skin. Arms. Back and abdomen  Dyspnea:  still doing treadmil fine no change ex tol  Cough: none  Sleeping: flat bed head up on 2 pillows s  resp cc  SABA use: none  02: none  No obvious day to day or daytime variability or assoc excess/ purulent sputum or mucus plugs or hemoptysis or cp or chest tightness, subjective wheeze or overt sinus or hb symptoms.    Also denies any obvious fluctuation of symptoms with weather or environmental changes or other aggravating or alleviating factors except as outlined above   No unusual exposure hx or h/o childhood pna/ asthma or knowledge of premature birth.  Current Allergies, Complete Past Medical History, Past Surgical History, Family History, and Social History were reviewed in [employer] record.  ROS  The following are not active complaints unless bolded Hoarseness, sore throat, dysphagia, dental problems, itching, sneezing,  nasal congestion or discharge of excess mucus or purulent secretions, ear ache,   fever, chills, sweats, unintended wt loss or wt gain, classically pleuritic or exertional cp,  orthopnea pnd or arm/hand swelling  or leg swelling, presyncope, palpitations,  abdominal pain, anorexia, nausea, vomiting, diarrhea  or change in bowel habits or change in bladder habits, change in stools or change in urine, dysuria, hematuria,  rash, arthralgias, visual complaints, headache, numbness, weakness or ataxia or problems with walking or coordination,  change in mood or  memory.        Current Meds - -     Medication Sig   hydroxychloroquine  (PLAQUENIL ) 200 MG tablet TAKE 2 TABLETS(400 MG) BY MOUTH DAILY (Only taking one)    predniSONE  (DELTASONE ) 10 MG tablet Just started 10mg  x 2 daily x 4 d prior to OV     zolpidem (AMBIEN) 10 MG tablet Take 10 mg by mouth at bedtime as needed for sleep.         Objective:   Physical Exam  Wts  09/09/2023      151  03/25/2023    157  07/18/2022    154  10/11/2020    145   06/12/2020      138 12/15/2019    135  03/11/2019      133 12/20/2018  130  11/08/2018    131  Wt 10/07/2011  126 vs 133  01/02/2012     Vital signs reviewed  09/09/2023  - Note at rest 02 sats  97% on RA   General appearance:    amb pleasant bf nad    HEENT : Oropharynx  clear      Nasal turbinates nl    NECK :  without  apparent JVD/ palpable Nodes/TM    LUNGS: no acc muscle use,  Nl contour chest which is clear to A and P bilaterally without cough on insp or exp maneuvers   CV:  RRR  no s3 or murmur or increase in P2, and no edema   ABD:  soft and nontender   MS:  Gait nl   ext warm without deformities Or obvious joint restrictions  calf tenderness, cyanosis or clubbing    SKIN: warm and dry with  MP erythematous to purplish  splotchy rash most severe over back   NEURO:  alert, approp, nl sensorium with  no motor or cerebellar deficits apparent.         ACE level 03/25/2023     =   102  (no change from prior)    Labs 09/09/2023  did not go to lab as rec         Assessment & Plan:   Assessment & Plan Sarcoidosis Clinical dx only with symptoms starting ? Nov 2012 mostly nasal     - Chronic prednisone   rx 08/22/2011 >  01/11/2013     -  Refer to opthamology 11/18/2011 >>> neg eval > referred back 04/01/12 for R eye swelling > 10/26/18 no sarcoid     - PFT's 07/29/13 >  wnl    - CXR 08/09/13  wnl     - new rash spring 2020>>>   bx 09/16/2018 ([facility]) : Granulomatous Dermatitis    - Angiotensin 09/27/2018  = 141 prior to any rx     - Plaquenil  400 mg daily x 09/30/2018     - taper off prednisone  by end of Dec 2020 > skin flared w/in a month as did nasal symptoms     - ACE  03/11/19 =  40 on plaquenil  400/ pred 5 mg daily     - Skin eruption 04/30/2021 on 5mg  every other day     - 03/25/2023 can't tol plaquenil  400mg  q day due to nausea with ACE 102 and low grade sarcoid dermatitits so rec try 200 mg bid and continue to titrate pred as low as possible   >>> 09/09/2023 acute flare on just plaquenil  200 so re 400 mg daily and pred @ 20 mg per day and taper as low as possible   >>> pt did not go to lab will be reminded need to repeat at least the labs  w/in a month of starting the high doses plaquenil  and notify her eye doctor of new dose as well   Discussed in detail all the  indications, usual  risks and alternatives  relative to the benefits with patient who agrees to proceed with Rx as outlined.      F/u in 6 m sooner I needed         Each maintenance medication was reviewed in detail including emphasizing most importantly the difference between maintenance and prns and under what circumstances the prns are to be triggered using an action plan format where appropriate.     Medical decision making was a moderate level of complexity in this case due to acute flare of a chronic condtion  requiring extra time for  H and P, chart review, counseling,  and generating customized AVS unique to this office visit and charting.         AVS  Patient Instructions  Plaqenil 200 mg twice daily with meals  Prednisone  10 mg twice daily until better then  taper as before     Please remember to go to the lab department   for your tests - we  will call you with the results when they are available.     Please schedule a follow up visit in 6 months but call sooner if needed      Ozell America, MD 09/12/2023

## 2023-09-09 ENCOUNTER — Encounter: Payer: Self-pay | Admitting: Internal Medicine

## 2023-09-09 ENCOUNTER — Ambulatory Visit (INDEPENDENT_AMBULATORY_CARE_PROVIDER_SITE_OTHER): Admitting: Internal Medicine

## 2023-09-09 VITALS — BP 106/70 | HR 81 | Temp 98.3°F | Ht 63.0 in | Wt 151.4 lb

## 2023-09-09 DIAGNOSIS — D869 Sarcoidosis, unspecified: Secondary | ICD-10-CM | POA: Diagnosis not present

## 2023-09-09 MED ORDER — HYDROXYCHLOROQUINE SULFATE 200 MG PO TABS: 200.0000 mg | ORAL_TABLET | Freq: Two times a day (BID) | ORAL | 2 refills | Status: AC

## 2023-09-09 NOTE — Patient Instructions (Addendum)
 Plaqenil 200 mg twice daily with meals  Prednisone  10 mg twice daily until better then  taper as before     Please remember to go to the lab department   for your tests - we will call you with the results when they are available.     Please schedule a follow up visit in 6 months but call sooner if needed

## 2023-09-12 ENCOUNTER — Telehealth: Payer: Self-pay | Admitting: Internal Medicine

## 2023-09-12 NOTE — Assessment & Plan Note (Addendum)
 Clinical dx only with symptoms starting ? Nov 2012 mostly nasal     - Chronic prednisone  rx 08/22/2011 >  01/11/2013     - Refer to opthamology 11/18/2011 >>> neg eval > referred back 04/01/12 for R eye swelling > 10/26/18 no sarcoid     - PFT's 07/29/13 >  wnl    - CXR 08/09/13  wnl     - new rash spring 2020>>>   bx 09/16/2018 ([facility]) : Granulomatous Dermatitis    - Angiotensin 09/27/2018  = 141 prior to any rx     - Plaquenil  400 mg daily x 09/30/2018     - taper off prednisone  by end of Dec 2020 > skin flared w/in a month as did nasal symptoms     - ACE  03/11/19 =  40 on plaquenil  400/ pred 5 mg daily     - Skin eruption 04/30/2021 on 5mg  every other day     - 03/25/2023 can't tol plaquenil  400mg  q day due to nausea with ACE 102 and low grade sarcoid dermatitits so rec try 200 mg bid and continue to titrate pred as low as possible   >>> 09/09/2023 acute flare on just plaquenil  200 so re 400 mg daily and pred @ 20 mg per day and taper as low as possible   >>> pt did not go to lab will be reminded need to repeat at least the labs  w/in a month of starting the high doses plaquenil  and notify her eye doctor of new dose as well   Discussed in detail all the  indications, usual  risks and alternatives  relative to the benefits with patient who agrees to proceed with Rx as outlined.      F/u in 6 m sooner I needed         Each maintenance medication was reviewed in detail including emphasizing most importantly the difference between maintenance and prns and under what circumstances the prns are to be triggered using an action plan format where appropriate.     Medical decision making was a moderate level of complexity in this case due to acute flare of a chronic condtion  requiring extra time for  H and P, chart review, counseling,  and generating customized AVS unique to this office visit and charting.

## 2023-09-12 NOTE — Telephone Encounter (Signed)
 Did not go to lab as rec but now that on new dose of plaquenil  too late so   >>>   Go for same labs at end of September (no need now)   and notify her eye doctor of new dose as well

## 2023-09-14 NOTE — Telephone Encounter (Signed)
 I called and spoke with the pt  I scheduled her for the end of the month to come for labs  She has appt with her eye doctor next week- have faxed copy of her ov note to them at fax number [phone] Nothing further needed

## 2023-10-06 ENCOUNTER — Other Ambulatory Visit (INDEPENDENT_AMBULATORY_CARE_PROVIDER_SITE_OTHER)

## 2023-10-06 DIAGNOSIS — D869 Sarcoidosis, unspecified: Secondary | ICD-10-CM | POA: Diagnosis not present

## 2023-10-06 LAB — CBC WITH DIFFERENTIAL/PLATELET
Basophils Absolute: 0 K/uL (ref 0.0–0.1)
Basophils Relative: 0.9 % (ref 0.0–3.0)
Eosinophils Absolute: 0.1 K/uL (ref 0.0–0.7)
Eosinophils Relative: 1.5 % (ref 0.0–5.0)
HCT: 41.7 % (ref 36.0–46.0)
Hemoglobin: 14.2 g/dL (ref 12.0–15.0)
Lymphocytes Relative: 12.9 % (ref 12.0–46.0)
Lymphs Abs: 0.7 K/uL (ref 0.7–4.0)
MCHC: 34.1 g/dL (ref 30.0–36.0)
MCV: 82.5 fl (ref 78.0–100.0)
Monocytes Absolute: 0.6 K/uL (ref 0.1–1.0)
Monocytes Relative: 10 % (ref 3.0–12.0)
Neutro Abs: 4.3 K/uL (ref 1.4–7.7)
Neutrophils Relative %: 74.7 % (ref 43.0–77.0)
Platelets: 386 K/uL (ref 150.0–400.0)
RBC: 5.06 Mil/uL (ref 3.87–5.11)
RDW: 14.1 % (ref 11.5–15.5)
WBC: 5.7 K/uL (ref 4.0–10.5)

## 2023-10-06 LAB — HEPATIC FUNCTION PANEL
ALT: 17 U/L (ref 0–35)
AST: 29 U/L (ref 0–37)
Albumin: 4 g/dL (ref 3.5–5.2)
Alkaline Phosphatase: 39 U/L (ref 39–117)
Bilirubin, Direct: 0.2 mg/dL (ref 0.0–0.3)
Total Bilirubin: 0.8 mg/dL (ref 0.2–1.2)
Total Protein: 6.5 g/dL (ref 6.0–8.3)

## 2023-10-06 LAB — SEDIMENTATION RATE: Sed Rate: 1 mm/h (ref 0–20)

## 2023-10-07 ENCOUNTER — Ambulatory Visit: Payer: Self-pay | Admitting: Internal Medicine

## 2023-10-07 NOTE — Progress Notes (Signed)
 Called and spoke to pt - advised of blood work results per Dr. Darlean. Patient verbalized understanding, NFN.

## 2023-11-03 ENCOUNTER — Ambulatory Visit: Admitting: Internal Medicine

## 2023-11-04 ENCOUNTER — Ambulatory Visit: Admitting: Internal Medicine

## 2023-11-04 ENCOUNTER — Encounter: Payer: Self-pay | Admitting: Internal Medicine

## 2023-11-04 ENCOUNTER — Ambulatory Visit (INDEPENDENT_AMBULATORY_CARE_PROVIDER_SITE_OTHER)

## 2023-11-04 VITALS — BP 106/76 | HR 66 | Ht 63.0 in | Wt 144.0 lb

## 2023-11-04 DIAGNOSIS — D869 Sarcoidosis, unspecified: Secondary | ICD-10-CM

## 2023-11-04 DIAGNOSIS — Z87891 Personal history of nicotine dependence: Secondary | ICD-10-CM

## 2023-11-04 MED ORDER — MIRTAZAPINE 15 MG PO TABS: ORAL_TABLET | ORAL | 11 refills | Status: AC

## 2023-11-04 NOTE — Progress Notes (Unsigned)
 Patient ID: Katelyn Stevens, female    DOB: 02-11-1980   MRN: [id number]   Brief patient profile:  43  yobf quit smoking 2012 then starting November 2012 onset of nasal congestion then cough and sob in winter of 2013 and then lymph node swelling spring 2013 and referred 08/21/2011 to pulmonary clinic by her brother with strong fm hx of sarcoid with skin bx c/w sarcoid 09/2018    History of Present Illness   08/21/2011 1st pulmonary eval cc can't breath through nose and green nasal drainage, much better p prednisone  x 2 weeks, much worse off it.  Min arthralgias, min sob better on prednisone , no occular complaints or rash. rec augmentin  x 10 days one twice daily (yogurt for lunch) Nasal saline irrigation and stop sinex unless you have to one side to sleep x 5 days only Prednisone  10 mg x 2 each until better then one each am Please schedule a follow up office visit in 6 weeks, call sooner if needed with cxr. I do recommend you see an MD eye doctor for a sarcoid eye exam > did not go    10/11/2020  f/u ov/Lillianna Sabel re: sarcoid maint on plaquenil  400 mg per day and pred 20 x UB > taper down one half qod  - last pred 20 was end of July 2022   Chief Complaint  Patient presents with   Follow-up    Patient states that she is having a sarcoid flare up, started about 3 days ago.   Dyspnea:  no sob  Cough: some nasal congestion, white thick Sleeping: no resp cc  SABA use: inhalers 02: noe  Covid status:   never vaccinated  Rec No change rx   Flared on plaquenil  200 mg every other day and no prednisone  x > one  month   07/18/2022  f/u ov/Arlethia Basso re: sarcoid with rhinitis / rash maint on plaquenil   200 mg one bid and prednisone  back to 0  Chief Complaint  Patient presents with   Follow-up    States improved since last OV, feeling nasal congestion, increased mucus   Dyspnea:  not limited by sob even treadmill Cough: little bit of pnd Sleeping: no resp cc  SABA use: none  Rec Plaquenil  200 mg  one twice daily  Predisone 20 mg per day until better then taper off   03/25/2023  f/u ov/Anthonymichael Munday re: sarcoid maint on plaqenil  200 mg once daily  last pred x sev weeks  Chief Complaint  Patient presents with   Follow-up   Dyspnea:  treadmill fine  Cough: none  Sleeping: flat bed / one pillow s resp cc  SABA use: none  02: none Rec Please remember to go to the lab department   ok calcium, no change ACE Please schedule a follow up visit in 12  months but call sooner if needed    09/09/2023  ACUTE  ov/Kanita Delage re: she noted asymptomatic rash worse x 5d    prior to OV on just plaquenil  200 mg so   restarted prednisone     4 d prior to OV   = 20mg  per day at time of ov Chief Complaint  Patient presents with   Sarcoidosis    Sarcoidosis flare on skin. Arms. Back and abdomen  Dyspnea:  still doing treadmil fine no change ex tol  Cough: none  Sleeping: flat bed head up on 2 pillows s  resp cc  SABA use: none  02: none  Rec Plaqenil 200 mg twice daily with meals Prednisone  10 mg twice daily until better then  taper as before  Please remember to go to the lab department   for your tests - we will call you with the results when they are available. Please schedule a follow up visit in 6 months but call sooner if needed    11/04/2023  f/u ov/Amaira Safley re: sarcoid rash  maint on plaqueni  200 mg twice daily  last dose of prednisone  10 mg was 10/25  and prior to that had been taking a total of 20 mg but not at bfast.  Chief Complaint  Patient presents with   Acute Visit    Not sleeping well x 3 wks- relates to side effects of prednisone .    Dyspnea:  no change ex tol  Cough: none  Sleeping: poorly due to insomnia not resp cc  resp cc  SABA use: none  02: none     No obvious day to day or daytime variability or assoc excess/ purulent sputum or mucus plugs or hemoptysis or cp or chest tightness, subjective wheeze or overt sinus or hb symptoms.    Also denies any obvious fluctuation of symptoms  with weather or environmental changes or other aggravating or alleviating factors except as outlined above   No unusual exposure hx or h/o childhood pna/ asthma or knowledge of premature birth.  Current Allergies, Complete Past Medical History, Past Surgical History, Family History, and Social History were reviewed in [employer] record.  ROS  The following are not active complaints unless bolded Hoarseness, sore throat, dysphagia, dental problems, itching, sneezing,  nasal congestion or discharge of excess mucus or purulent secretions, ear ache,   fever, chills, sweats, unintended wt loss or wt gain, classically pleuritic or exertional cp,  orthopnea pnd or arm/hand swelling  or leg swelling, presyncope, palpitations, abdominal pain, anorexia, nausea, vomiting, diarrhea  or change in bowel habits or change in bladder habits, change in stools or change in urine, dysuria, hematuria,  rash, arthralgias, visual complaints, headache, numbness, weakness or ataxia or problems with walking or coordination,  change in mood or  memory.        Current Meds  Medication Sig   hydroxychloroquine  (PLAQUENIL ) 200 MG tablet Take 1 tablet (200 mg total) by mouth 2 (two) times daily.   zolpidem (AMBIEN) 10 MG tablet Take 10 mg by mouth at bedtime as needed for sleep.                 Objective:   Physical Exam  Wts  11/04/2023  144 09/09/2023      151  03/25/2023    157  07/18/2022    154  10/11/2020    145   06/12/2020      138 12/15/2019    135  03/11/2019      133 12/20/2018  130  11/08/2018    131  Wt 10/07/2011  126 vs 133  01/02/2012    Vital signs reviewed  11/04/2023  - Note at rest 02 sats  100% on RA   General appearance:    somber amb bf nad   HEENT : Oropharynx  nl      Nasal turbinates nl    NECK :  without  apparent JVD/ palpable Nodes/TM    LUNGS: no acc muscle use,  Nl contour chest which is clear to A and P bilaterally without cough on insp or exp  maneuvers   CV:  RRR  no s3 or murmur or increase in P2, and no edema   ABD:  soft and nontender   MS:  Gait nl   ext warm without deformities Or obvious joint restrictions  calf tenderness, cyanosis or clubbing    SKIN: warm and dry with geographic pattern purplish macular rash over trunk post > ant   NEURO:  alert, approp, nl sensorium with  no motor or cerebellar deficits apparent.     CXR PA and Lateral:   11/04/2023 :    I personally reviewed images and impression is as follows:     No active sarcoid     Assessment & Plan:   Assessment & Plan Sarcoidosis Clinical dx only with symptoms starting ? Nov 2012 mostly nasal     - Chronic prednisone  rx 08/22/2011 >  01/11/2013     - Refer to opthamology 11/18/2011 >>> neg eval > referred back 04/01/12 for R eye swelling > 10/26/18 no sarcoid     - PFT's 07/29/13 >  wnl    - CXR 08/09/13  wnl     - new rash spring 2020>>>   bx 09/16/2018 ([facility]) : Granulomatous Dermatitis    - Angiotensin 09/27/2018  = 141 prior to any rx     - Plaquenil  400 mg daily x 09/30/2018     - taper off prednisone  by end of Dec 2020 > skin flared w/in a month as did nasal symptoms     - ACE  03/11/19 =  40 on plaquenil  400/ pred 5 mg daily     - Skin eruption 04/30/2021 on 5mg  every other day     - 03/25/2023 can't tol plaquenil  400mg  q day due to nausea with ACE 102 and low grade sarcoid dermatitits so rec try 200 mg bid and continue to titrate pred as low as possible     - 09/09/2023 acute flare on just plaquenil  200 so re 400 mg daily and pred @ 20 mg per day and taper as low as possible but instead stopped it cold turkey due to insomnia  with rebound flare of rash last  2 weeks in October  - 11/04/2023 cc insomnia p taking pred in pm and started on ambien /could not tolerate trazadone so rec  >>>  remeron 15 mg hs, ambien 10 mg one half  every other day  x 3 weeks to help with w/d   >>> wean pred to floor of 5 mg q am until seen by Dr Kassie / see derm if that  is the only evidence of systemic sarcoid     AVS  Patient Instructions  Prednisone  is 10 mg pill take x 2 first thing in AM until rash is  better then  1 daily x one week then one half daily until  seen  - if flare go back to previous   Plaquenil  200 mg x 2 each am  Ambien 10 mg one half every other day x  3 weeks then stop  Remeron  15  mg one an hour before bed until seen   Refer to Dr Kassie for sarcoidosis evaluation   Please remember to go to the  x-ray department  for your tests - we will call you with the results when they are available     Ozell America, MD 11/05/2023

## 2023-11-04 NOTE — Patient Instructions (Addendum)
 Prednisone  is 10 mg pill take x 2 first thing in AM until rash is  better then  1 daily x one week then one half daily until  seen  - if flare go back to previous   Plaquenil  200 mg x 2 each am  Ambien 10 mg one half every other day x  3 weeks then stop  Remeron  15  mg one an hour before bed until seen   Refer to Dr Kassie for sarcoidosis evaluation   Please remember to go to the  x-ray department  for your tests - we will call you with the results when they are available

## 2023-11-05 NOTE — Assessment & Plan Note (Addendum)
 Clinical dx only with symptoms starting ? Nov 2012 mostly nasal     - Chronic prednisone  rx 08/22/2011 >  01/11/2013     - Refer to opthamology 11/18/2011 >>> neg eval > referred back 04/01/12 for R eye swelling > 10/26/18 no sarcoid     - PFT's 07/29/13 >  wnl    - CXR 08/09/13  wnl     - new rash spring 2020>>>   bx 09/16/2018 ([facility]) : Granulomatous Dermatitis    - Angiotensin 09/27/2018  = 141 prior to any rx     - Plaquenil  400 mg daily x 09/30/2018     - taper off prednisone  by end of Dec 2020 > skin flared w/in a month as did nasal symptoms     - ACE  03/11/19 =  40 on plaquenil  400/ pred 5 mg daily     - Skin eruption 04/30/2021 on 5mg  every other day     - 03/25/2023 can't tol plaquenil  400mg  q day due to nausea with ACE 102 and low grade sarcoid dermatitits so rec try 200 mg bid and continue to titrate pred as low as possible     - 09/09/2023 acute flare on just plaquenil  200 so re 400 mg daily and pred @ 20 mg per day and taper as low as possible but instead stopped it cold turkey due to insomnia  with rebound flare of rash last  2 weeks in October  - 11/04/2023 cc insomnia p taking pred in pm and started on ambien /could not tolerate trazadone so rec  >>>  remeron 15 mg hs, ambien 10 mg one half  every other day  x 3 weeks to help with w/d   >>> wean pred to floor of 5 mg q am until seen by Dr Kassie / see derm if that is the only evidence of systemic sarcoid

## 2023-11-07 ENCOUNTER — Ambulatory Visit: Payer: Self-pay | Admitting: Internal Medicine

## 2023-11-09 NOTE — Progress Notes (Signed)
 Called and spoke to pt - advised of CXR results per Dr. Darlean. Pt verbalized understanding, NFN.

## 2023-11-19 ENCOUNTER — Ambulatory Visit: Payer: Self-pay | Admitting: Internal Medicine

## 2023-11-19 DIAGNOSIS — G4700 Insomnia, unspecified: Secondary | ICD-10-CM

## 2023-11-19 NOTE — Telephone Encounter (Signed)
 FYI Only or Action Required?: Action required by provider: update on patient condition. Pharmacy confirmed  Patient is followed in Pulmonology for Sarcoidosis, last seen on 11/04/2023 by Katelyn Ozell NOVAK, MD.  Called Nurse Triage reporting Numbness.  Symptoms began a week ago.  Interventions attempted: Nothing.  Symptoms are: unchanged.  Triage Disposition: See PCP When Office is Open (Within 3 Days)  Patient/caregiver understands and will follow disposition?: No, wishes to speak with PCP  Message from [facility] sent at 11/19/2023 11:35 AM EST  Summary: Medication reaction   Reason for Triage: Patient [phone] states has taken mirtazapine (REMERON) 15 MG tablet for 10 days and lost sensation in both nipples, buttocks and vagina area. Patient denies pain, swelling nor fever. Please advise and call back.         Reason for Disposition  [1] Numbness or tingling on both sides of body AND [2] is a new symptom present > 24 hours  Answer Assessment - Initial Assessment Questions Pt seen in Pulm for sarcoidosis. Provider is weaning her off of Ambien and prescribed Remeron which she has been on for 10 days. About day 7 her and her husband went to get intimate and she noticed that she wasn't feeling the sensations where she is normally very sensitive. Last night she only took 7.5mg  instead of 15mg  to try to sleep. Advised to stop the medication tonight. It may be interacting with her other medications and causing side effects. Denies fever, unilateral weakness, vision changes, speech difficulty, CP or SOB.  Please advise on alternative medication to assist with sleep weaning off ambien. She does not have a PCP as she only works with specialty for her Sarcoidosis.   1. SYMPTOM: What is the main symptom you are concerned about? (e.g., weakness, numbness)     Numbness and sensation loss of both nipples, vagina, and buttocks area.  2. ONSET: When did this start? (e.g., minutes, hours,  days; while sleeping)     7 days ago 3. LAST NORMAL: When was the last time you (the patient) were normal (no symptoms)?     7 days ago 4. PATTERN Does this come and go, or has it been constant since it started?  Is it present now?     constant 5. CARDIAC SYMPTOMS: Have you had any of the following symptoms: chest pain, difficulty breathing, palpitations?     denies 6. NEUROLOGIC SYMPTOMS: Have you had any of the following symptoms: headache, dizziness, vision loss, double vision, changes in speech, unsteady on your feet?     Buttocks have decreased sensation- can only feel sharp.  7. OTHER SYMPTOMS: Do you have any other symptoms?     denies  Protocols used: Neurologic Deficit-A-AH

## 2023-11-19 NOTE — Telephone Encounter (Signed)
 Patient is aware of below message and voiced her understanding.  Referral has been placed to PCP.  Nothing further needed.

## 2023-11-19 NOTE — Telephone Encounter (Signed)
 Spoke to patient and advised her that a message has been sent to Dr. Darlean for recommendations/alternative.  Dr. Darlean, please advise. Thanks

## 2023-11-19 NOTE — Addendum Note (Signed)
 Addended by: CLAUDENE NEVINS A on: 11/19/2023 02:48 PM   Modules accepted: Orders

## 2023-11-27 ENCOUNTER — Encounter (HOSPITAL_BASED_OUTPATIENT_CLINIC_OR_DEPARTMENT_OTHER): Payer: Self-pay | Admitting: Pulmonary Disease

## 2023-11-27 ENCOUNTER — Ambulatory Visit (INDEPENDENT_AMBULATORY_CARE_PROVIDER_SITE_OTHER): Admitting: Pulmonary Disease

## 2023-11-27 VITALS — BP 97/67 | HR 71 | Temp 98.5°F | Ht 63.0 in | Wt 143.6 lb

## 2023-11-27 DIAGNOSIS — D869 Sarcoidosis, unspecified: Secondary | ICD-10-CM

## 2023-11-27 DIAGNOSIS — Z87891 Personal history of nicotine dependence: Secondary | ICD-10-CM | POA: Diagnosis not present

## 2023-11-27 DIAGNOSIS — Z9225 Personal history of immunosupression therapy: Secondary | ICD-10-CM

## 2023-11-27 MED ORDER — PREDNISONE 10 MG PO TABS: ORAL_TABLET | ORAL | 0 refills | Status: AC

## 2023-11-27 MED ORDER — TRIAMCINOLONE ACETONIDE 0.5 % EX OINT: 1.0000 | TOPICAL_OINTMENT | Freq: Two times a day (BID) | CUTANEOUS | 0 refills | Status: AC

## 2023-11-27 NOTE — Assessment & Plan Note (Addendum)
-   Presumed pulmonary involvement. Mild restrictive defect 2015. Last CT 06/15/11 with hilar adenopathy. 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

## 2023-11-27 NOTE — Patient Instructions (Addendum)
  Sarcoidosis - Presumed pulmonary involvement. Mild restrictive defect 2015. Last CT 06/15/11 with hilar adenopathy. 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 Followed by ophthalmology annually. Please have them fax me the note [phone]) --Prednisone  ordered for flares. Please document when you start and stop steroids --Triamincolone cream 0.05% apply to affected site until resolved

## 2023-11-27 NOTE — Progress Notes (Unsigned)
 Subjective:   PATIENT ID: Katelyn Stevens GENDER: female DOB: 1980-11-30, MRN: [id number]  Chief Complaint  Patient presents with   Sarcoidosis    Reason for Visit: New patient to me  Katelyn Stevens is a 43 year old female with negligible smoking history (<3 pack-years, quit 2012) with presumed sarcoidosis who presents to establish care.   She was previously followed by Dr. Darlean since 2013 for presumed pulmonary sarcoid treated with prolonged steroids in 2015,  had skin biopsy 09/2018 thought to be c/w with sarcoid. She was on prednisone  tapers and plaquenil . Has not seen [facility] in 2021.   She has skin flare ups on back and abdomen every 2-3 months and have been lasting longer, up to 10 days which was previously 3 days. She will usually require prednisone  tapers four times a year (20 mg daily>10 mg day > 10 mg every other day). On hydroxychloroquine  200 mg daily and has been followed by ophthalmology annually. She reports stopping hydroxychloroquine  for 1-2 months over the same any believes her skin flares have worsened.  Denies shortness of breath, cough, wheezing or chest pain.   Social History: Remote smoker  I have personally reviewed patient's past medical/family/social history, allergies, current medications.  Past Medical History:  Diagnosis Date   Sarcoidosis      Family History  Problem Relation Age of Onset   Breast cancer Mother    Liver cancer Maternal Grandmother      Social History   Occupational History   Occupation: Teaching Laboratory Technician: [employer]  Tobacco Use   Smoking status: Former    Current packs/day: 0.00    Average packs/day: 0.5 packs/day for 6.0 years (3.0 ttl pk-yrs)    Types: Cigarettes    Start date: 08/06/2004    Quit date: 08/07/2010    Years since quitting: 13.3   Smokeless tobacco: Never  Vaping Use   Vaping status: Never Used  Substance and Sexual Activity   Alcohol use: Yes    Alcohol/week: 3.0 standard  drinks of alcohol    Types: 3 Shots of liquor per week    Comment: 2 glasses a week    Drug use: No   Sexual activity: Yes    Birth control/protection: Pill    Allergies  Allergen Reactions   Aspirin Other (See Comments)    Facial swelling     Outpatient Medications Prior to Visit  Medication Sig Dispense Refill   hydroxychloroquine  (PLAQUENIL ) 200 MG tablet Take 1 tablet (200 mg total) by mouth 2 (two) times daily. 180 tablet 2   predniSONE  (DELTASONE ) 10 MG tablet TAKE 2 TABLETS BY MOUTH EVERY DAY WITH BREAKFAST UNTIL BETTER, THEN 1 EVERY DAY 100 tablet 0   mirtazapine  (REMERON ) 15 MG tablet One an hour before  bedtime (Patient not taking: Reported on 11/27/2023) 30 tablet 11   zolpidem (AMBIEN) 10 MG tablet Take 10 mg by mouth at bedtime as needed for sleep. (Patient not taking: Reported on 11/27/2023)     No facility-administered medications prior to visit.    ROS   Objective:   Vitals:   11/27/23 1006  BP: 97/67  Pulse: 71  Temp: 98.5 F (36.9 C)  SpO2: 99%  Weight: 143 lb 9.6 oz (65.1 kg)  Height: 5' 3 (1.6 m)   SpO2: 99 %  Physical Exam: General: Well-appearing, no acute distress HENT: [place], AT Eyes: EOMI, no scleral icterus Respiratory: ***Clear to auscultation bilaterally.  No crackles, wheezing or rales  Cardiovascular: RRR, -M/R/G, no JVD Extremities:-Edema,-tenderness Neuro: AAO x4, CNII-XII grossly intact Psych: Normal mood, normal affect  Data Reviewed:  Imaging:  PFT: 07/29/13 FVC 2.62 (89%) FEV1 2.24 (90%) Ratio 85  TLC 75% DLCO 82% Interpretation: Mild restrictive defect. No bronchodilator response  Labs: CBC    Component Value Date/Time   WBC 5.7 10/06/2023 1311   RBC 5.06 10/06/2023 1311   HGB 14.2 10/06/2023 1311   HCT 41.7 10/06/2023 1311   PLT 386.0 10/06/2023 1311   MCV 82.5 10/06/2023 1311   MCH 27.7 07/18/2013 1730   MCHC 34.1 10/06/2023 1311   RDW 14.1 10/06/2023 1311   LYMPHSABS 0.7 10/06/2023 1311   MONOABS 0.6  10/06/2023 1311   EOSABS 0.1 10/06/2023 1311   BASOSABS 0.0 10/06/2023 1311      Latest Ref Rng & Units 10/06/2023    1:11 PM 03/25/2023    4:03 PM 07/18/2022   11:02 AM  CMP  Glucose 70 - 99 mg/dL  69  82   BUN 6 - 23 mg/dL  8  10   Creatinine 9.59 - 1.20 mg/dL  9.12  9.12   Sodium 864 - 145 mEq/L  137  138   Potassium 3.5 - 5.1 mEq/L  4.2  4.1   Chloride 96 - 112 mEq/L  104  105   CO2 19 - 32 mEq/L  27  27   Calcium 8.4 - 10.5 mg/dL  9.2  9.3   Total Protein 6.0 - 8.3 g/dL 6.5  6.7  6.6   Total Bilirubin 0.2 - 1.2 mg/dL 0.8  0.5  0.9   Alkaline Phos 39 - 117 U/L 39  46  45   AST 0 - 37 U/L 29  19  19    ALT 0 - 35 U/L 17  10  13     Normal blood counts, electrolytes, renal and renal function     Assessment & Plan:   Discussion: HPI  Assessment & Plan Sarcoidosis - Presumed pulmonary involvement. Mild restrictive defect 2015. Last CT 06/15/11 with hilar adenopathy. 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 Followed by ophthalmology annually. Please have them fax me the note [phone]) --Prednisone  ordered for flares. Please document when you start and stop steroids   Health Maintenance There is no immunization history for the selected administration types on file for this patient. CT Lung Screen - not indicated  No orders of the defined types were placed in this encounter. No orders of the defined types were placed in this encounter.   No follow-ups on file.  I have spent a total time of***-minutes on the day of the appointment reviewing prior documentation, coordinating care and discussing medical diagnosis and plan with the patient/family. Imaging,  labs and tests included in this note have been reviewed and interpreted independently by me.  Argenis Kumari Slater Staff, MD [facility] 11/27/2023 10:09 AM

## 2024-01-27 ENCOUNTER — Ambulatory Visit (HOSPITAL_BASED_OUTPATIENT_CLINIC_OR_DEPARTMENT_OTHER)

## 2024-01-28 ENCOUNTER — Ambulatory Visit (HOSPITAL_BASED_OUTPATIENT_CLINIC_OR_DEPARTMENT_OTHER)
Admission: RE | Admit: 2024-01-28 | Discharge: 2024-01-28 | Disposition: A | Discharge status: Home / Self Care | Source: Ambulatory Visit | Attending: Pulmonary Disease | Admitting: Pulmonary Disease

## 2024-01-28 DIAGNOSIS — D869 Sarcoidosis, unspecified: Secondary | ICD-10-CM | POA: Insufficient documentation

## 2024-02-02 ENCOUNTER — Ambulatory Visit (HOSPITAL_BASED_OUTPATIENT_CLINIC_OR_DEPARTMENT_OTHER): Admitting: Pulmonary Disease

## 2024-02-03 ENCOUNTER — Ambulatory Visit (HOSPITAL_BASED_OUTPATIENT_CLINIC_OR_DEPARTMENT_OTHER): Payer: Self-pay | Admitting: Pulmonary Disease

## 2024-02-22 ENCOUNTER — Ambulatory Visit (HOSPITAL_BASED_OUTPATIENT_CLINIC_OR_DEPARTMENT_OTHER): Admitting: Pulmonary Disease
